# Patient Record
Sex: Female | Born: 1935 | Race: White | Hispanic: No | Marital: Married | Smoking: Former smoker
Health system: Southern US, Community
[De-identification: ages and names within clinical notes are randomized; demographics above are authoritative.]

## PROBLEM LIST (undated history)

## (undated) DIAGNOSIS — M1990 Unspecified osteoarthritis, unspecified site: Secondary | ICD-10-CM

## (undated) DIAGNOSIS — J449 Chronic obstructive pulmonary disease, unspecified: Secondary | ICD-10-CM

## (undated) DIAGNOSIS — C801 Malignant (primary) neoplasm, unspecified: Secondary | ICD-10-CM

## (undated) DIAGNOSIS — N289 Disorder of kidney and ureter, unspecified: Secondary | ICD-10-CM

## (undated) DIAGNOSIS — Z5189 Encounter for other specified aftercare: Secondary | ICD-10-CM

## (undated) DIAGNOSIS — I639 Cerebral infarction, unspecified: Secondary | ICD-10-CM

## (undated) HISTORY — PX: COLON SURGERY: SHX602

## (undated) HISTORY — PX: MASTECTOMY: SHX3

## (undated) HISTORY — PX: EYE SURGERY: SHX253

## (undated) HISTORY — PX: TONSILLECTOMY: SUR1361

## (undated) HISTORY — PX: SKIN BIOPSY: SHX1

---

## 1998-03-08 ENCOUNTER — Ambulatory Visit (HOSPITAL_COMMUNITY): Admission: RE | Admit: 1998-03-08 | Discharge: 1998-03-08 | Payer: Self-pay | Admitting: Gastroenterology

## 1999-09-22 ENCOUNTER — Encounter: Admission: RE | Admit: 1999-09-22 | Discharge: 1999-09-22 | Payer: Self-pay | Admitting: Internal Medicine

## 1999-09-22 ENCOUNTER — Encounter: Payer: Self-pay | Admitting: Internal Medicine

## 1999-09-29 ENCOUNTER — Encounter: Payer: Self-pay | Admitting: Internal Medicine

## 1999-09-29 ENCOUNTER — Ambulatory Visit (HOSPITAL_COMMUNITY): Admission: RE | Admit: 1999-09-29 | Discharge: 1999-09-29 | Payer: Self-pay | Admitting: Diagnostic Radiology

## 1999-09-29 ENCOUNTER — Encounter (INDEPENDENT_AMBULATORY_CARE_PROVIDER_SITE_OTHER): Payer: Self-pay | Admitting: Specialist

## 1999-10-20 ENCOUNTER — Ambulatory Visit (HOSPITAL_COMMUNITY): Admission: RE | Admit: 1999-10-20 | Discharge: 1999-10-20 | Payer: Self-pay | Admitting: General Surgery

## 1999-10-20 ENCOUNTER — Encounter (INDEPENDENT_AMBULATORY_CARE_PROVIDER_SITE_OTHER): Payer: Self-pay

## 2000-07-29 ENCOUNTER — Encounter: Payer: Self-pay | Admitting: Internal Medicine

## 2000-07-29 ENCOUNTER — Encounter: Admission: RE | Admit: 2000-07-29 | Discharge: 2000-07-29 | Payer: Self-pay | Admitting: Internal Medicine

## 2000-09-27 ENCOUNTER — Emergency Department (HOSPITAL_COMMUNITY): Admission: EM | Admit: 2000-09-27 | Discharge: 2000-09-27 | Payer: Self-pay | Admitting: *Deleted

## 2000-10-15 ENCOUNTER — Encounter: Payer: Self-pay | Admitting: Orthopedic Surgery

## 2000-10-15 ENCOUNTER — Ambulatory Visit (HOSPITAL_COMMUNITY): Admission: RE | Admit: 2000-10-15 | Discharge: 2000-10-15 | Payer: Self-pay | Admitting: Orthopedic Surgery

## 2001-08-01 ENCOUNTER — Encounter: Admission: RE | Admit: 2001-08-01 | Discharge: 2001-08-01 | Payer: Self-pay | Admitting: Internal Medicine

## 2001-08-01 ENCOUNTER — Encounter: Payer: Self-pay | Admitting: Internal Medicine

## 2001-11-07 ENCOUNTER — Other Ambulatory Visit: Admission: RE | Admit: 2001-11-07 | Discharge: 2001-11-07 | Payer: Self-pay | Admitting: Internal Medicine

## 2002-08-04 ENCOUNTER — Encounter: Payer: Self-pay | Admitting: Internal Medicine

## 2002-08-04 ENCOUNTER — Encounter: Admission: RE | Admit: 2002-08-04 | Discharge: 2002-08-04 | Payer: Self-pay | Admitting: Internal Medicine

## 2003-04-03 ENCOUNTER — Emergency Department (HOSPITAL_COMMUNITY): Admission: EM | Admit: 2003-04-03 | Discharge: 2003-04-03 | Payer: Self-pay | Admitting: Emergency Medicine

## 2003-04-03 ENCOUNTER — Encounter: Payer: Self-pay | Admitting: Emergency Medicine

## 2003-04-16 ENCOUNTER — Ambulatory Visit (HOSPITAL_COMMUNITY): Admission: RE | Admit: 2003-04-16 | Discharge: 2003-04-16 | Payer: Self-pay | Admitting: Internal Medicine

## 2003-04-16 ENCOUNTER — Encounter: Payer: Self-pay | Admitting: Internal Medicine

## 2004-01-11 ENCOUNTER — Encounter: Admission: RE | Admit: 2004-01-11 | Discharge: 2004-01-11 | Payer: Self-pay | Admitting: Internal Medicine

## 2004-09-16 ENCOUNTER — Ambulatory Visit (HOSPITAL_COMMUNITY): Admission: RE | Admit: 2004-09-16 | Discharge: 2004-09-16 | Payer: Self-pay | Admitting: Internal Medicine

## 2005-01-20 ENCOUNTER — Encounter: Admission: RE | Admit: 2005-01-20 | Discharge: 2005-01-20 | Payer: Self-pay | Admitting: Internal Medicine

## 2005-08-04 ENCOUNTER — Emergency Department (HOSPITAL_COMMUNITY): Admission: EM | Admit: 2005-08-04 | Discharge: 2005-08-04 | Payer: Self-pay | Admitting: Emergency Medicine

## 2006-02-15 ENCOUNTER — Encounter: Admission: RE | Admit: 2006-02-15 | Discharge: 2006-02-15 | Payer: Self-pay | Admitting: Internal Medicine

## 2006-03-08 ENCOUNTER — Encounter: Admission: RE | Admit: 2006-03-08 | Discharge: 2006-03-08 | Payer: Self-pay | Admitting: Internal Medicine

## 2007-02-22 ENCOUNTER — Encounter: Admission: RE | Admit: 2007-02-22 | Discharge: 2007-02-22 | Payer: Self-pay | Admitting: Internal Medicine

## 2007-11-25 ENCOUNTER — Encounter: Admission: RE | Admit: 2007-11-25 | Discharge: 2007-11-25 | Payer: Self-pay | Admitting: Internal Medicine

## 2008-03-01 ENCOUNTER — Encounter: Admission: RE | Admit: 2008-03-01 | Discharge: 2008-03-01 | Payer: Self-pay | Admitting: Internal Medicine

## 2009-03-06 ENCOUNTER — Encounter: Admission: RE | Admit: 2009-03-06 | Discharge: 2009-03-06 | Payer: Self-pay | Admitting: Internal Medicine

## 2010-03-07 ENCOUNTER — Encounter: Admission: RE | Admit: 2010-03-07 | Discharge: 2010-03-07 | Payer: Self-pay | Admitting: Internal Medicine

## 2010-12-07 ENCOUNTER — Encounter: Payer: Self-pay | Admitting: Internal Medicine

## 2012-12-11 ENCOUNTER — Observation Stay (HOSPITAL_COMMUNITY)
Admission: EM | Admit: 2012-12-11 | Discharge: 2012-12-14 | Disposition: A | Discharge status: Skilled Nursing Facility | Payer: Medicare Other | Attending: Internal Medicine | Admitting: Internal Medicine

## 2012-12-11 ENCOUNTER — Encounter (HOSPITAL_COMMUNITY): Payer: Self-pay | Admitting: Emergency Medicine

## 2012-12-11 ENCOUNTER — Emergency Department (HOSPITAL_COMMUNITY): Payer: Medicare Other

## 2012-12-11 DIAGNOSIS — J449 Chronic obstructive pulmonary disease, unspecified: Secondary | ICD-10-CM

## 2012-12-11 DIAGNOSIS — M8000XA Age-related osteoporosis with current pathological fracture, unspecified site, initial encounter for fracture: Secondary | ICD-10-CM | POA: Diagnosis present

## 2012-12-11 DIAGNOSIS — K90829 Short bowel syndrome, unspecified: Secondary | ICD-10-CM | POA: Diagnosis present

## 2012-12-11 DIAGNOSIS — IMO0002 Reserved for concepts with insufficient information to code with codable children: Secondary | ICD-10-CM

## 2012-12-11 DIAGNOSIS — K912 Postsurgical malabsorption, not elsewhere classified: Secondary | ICD-10-CM | POA: Insufficient documentation

## 2012-12-11 DIAGNOSIS — Z853 Personal history of malignant neoplasm of breast: Secondary | ICD-10-CM

## 2012-12-11 DIAGNOSIS — Z8673 Personal history of transient ischemic attack (TIA), and cerebral infarction without residual deficits: Secondary | ICD-10-CM | POA: Insufficient documentation

## 2012-12-11 DIAGNOSIS — H543 Unqualified visual loss, both eyes: Secondary | ICD-10-CM | POA: Diagnosis present

## 2012-12-11 DIAGNOSIS — N289 Disorder of kidney and ureter, unspecified: Secondary | ICD-10-CM | POA: Insufficient documentation

## 2012-12-11 DIAGNOSIS — M810 Age-related osteoporosis without current pathological fracture: Secondary | ICD-10-CM | POA: Insufficient documentation

## 2012-12-11 DIAGNOSIS — M549 Dorsalgia, unspecified: Secondary | ICD-10-CM

## 2012-12-11 DIAGNOSIS — N183 Chronic kidney disease, stage 3 unspecified: Secondary | ICD-10-CM | POA: Insufficient documentation

## 2012-12-11 DIAGNOSIS — W19XXXA Unspecified fall, initial encounter: Secondary | ICD-10-CM | POA: Insufficient documentation

## 2012-12-11 DIAGNOSIS — H3530 Unspecified macular degeneration: Secondary | ICD-10-CM | POA: Diagnosis present

## 2012-12-11 DIAGNOSIS — J4489 Other specified chronic obstructive pulmonary disease: Secondary | ICD-10-CM | POA: Insufficient documentation

## 2012-12-11 DIAGNOSIS — X58XXXA Exposure to other specified factors, initial encounter: Secondary | ICD-10-CM | POA: Insufficient documentation

## 2012-12-11 DIAGNOSIS — T148XXA Other injury of unspecified body region, initial encounter: Secondary | ICD-10-CM

## 2012-12-11 DIAGNOSIS — S22009A Unspecified fracture of unspecified thoracic vertebra, initial encounter for closed fracture: Principal | ICD-10-CM | POA: Insufficient documentation

## 2012-12-11 DIAGNOSIS — N189 Chronic kidney disease, unspecified: Secondary | ICD-10-CM

## 2012-12-11 DIAGNOSIS — M8448XA Pathological fracture, other site, initial encounter for fracture: Secondary | ICD-10-CM | POA: Insufficient documentation

## 2012-12-11 DIAGNOSIS — S22080A Wedge compression fracture of T11-T12 vertebra, initial encounter for closed fracture: Secondary | ICD-10-CM

## 2012-12-11 HISTORY — DX: Encounter for other specified aftercare: Z51.89

## 2012-12-11 HISTORY — DX: Malignant (primary) neoplasm, unspecified: C80.1

## 2012-12-11 HISTORY — DX: Cerebral infarction, unspecified: I63.9

## 2012-12-11 HISTORY — DX: Unspecified osteoarthritis, unspecified site: M19.90

## 2012-12-11 HISTORY — DX: Chronic obstructive pulmonary disease, unspecified: J44.9

## 2012-12-11 HISTORY — DX: Disorder of kidney and ureter, unspecified: N28.9

## 2012-12-11 LAB — CREATININE, SERUM
Creatinine, Ser: 1.7 mg/dL — ABNORMAL HIGH (ref 0.50–1.10)
GFR calc Af Amer: 29 mL/min — ABNORMAL LOW (ref >90)
GFR calc non Af Amer: 25 mL/min — ABNORMAL LOW (ref >90)

## 2012-12-11 LAB — CBC
HCT: 39.4 % (ref 36.0–46.0)
Hemoglobin: 13.4 g/dL (ref 12.0–15.0)
MCH: 30.1 pg (ref 26.0–34.0)
Platelets: 138 10*3/uL — ABNORMAL LOW (ref 150–400)
RDW: 12.9 % (ref 11.5–15.5)

## 2012-12-11 LAB — POCT I-STAT, CHEM 8
Calcium, Ion: 1.21 mmol/L (ref 1.13–1.30)
Glucose, Bld: 111 mg/dL — ABNORMAL HIGH (ref 70–99)
HCT: 39 % (ref 36.0–46.0)
Hemoglobin: 13.3 g/dL (ref 12.0–15.0)

## 2012-12-11 LAB — URINALYSIS, ROUTINE W REFLEX MICROSCOPIC
Glucose, UA: NEGATIVE mg/dL
Protein, ur: NEGATIVE mg/dL
Specific Gravity, Urine: 1.022 (ref 1.005–1.030)

## 2012-12-11 MED ORDER — HYDROMORPHONE HCL PF 1 MG/ML IJ SOLN
0.5000 mg | INTRAMUSCULAR | Status: DC | PRN
  Administered 2012-12-11: 1 mg via INTRAVENOUS
  Filled 2012-12-11: qty 1

## 2012-12-11 MED ORDER — METHOCARBAMOL 500 MG PO TABS
500.0000 mg | ORAL_TABLET | Freq: Three times a day (TID) | ORAL | Status: DC
  Administered 2012-12-11 – 2012-12-14 (×9): 500 mg via ORAL
  Filled 2012-12-11 (×14): qty 1

## 2012-12-11 MED ORDER — ONDANSETRON HCL 4 MG/2ML IJ SOLN
4.0000 mg | Freq: Once | INTRAMUSCULAR | Status: AC
  Administered 2012-12-11: 4 mg via INTRAVENOUS
  Filled 2012-12-11: qty 2

## 2012-12-11 MED ORDER — HYDROCODONE-ACETAMINOPHEN 5-325 MG PO TABS
1.0000 | ORAL_TABLET | ORAL | Status: DC | PRN
  Administered 2012-12-11 – 2012-12-14 (×12): 2 via ORAL
  Filled 2012-12-11 (×10): qty 2
  Filled 2012-12-11: qty 1
  Filled 2012-12-11: qty 2
  Filled 2012-12-11: qty 1

## 2012-12-11 MED ORDER — TRIAMTERENE-HCTZ 75-50 MG PO TABS
1.0000 | ORAL_TABLET | Freq: Every morning | ORAL | Status: DC
  Administered 2012-12-11 – 2012-12-14 (×4): 1 via ORAL
  Filled 2012-12-11 (×4): qty 1

## 2012-12-11 MED ORDER — ONDANSETRON HCL 4 MG/2ML IJ SOLN: 4.0000 mg | Freq: Four times a day (QID) | INTRAMUSCULAR | Status: DC | PRN

## 2012-12-11 MED ORDER — MORPHINE SULFATE 2 MG/ML IJ SOLN
1.0000 mg | INTRAMUSCULAR | Status: DC | PRN
  Administered 2012-12-12 – 2012-12-14 (×3): 2 mg via INTRAVENOUS
  Filled 2012-12-11 (×3): qty 1

## 2012-12-11 MED ORDER — ENOXAPARIN SODIUM 30 MG/0.3ML ~~LOC~~ SOLN
30.0000 mg | SUBCUTANEOUS | Status: DC
  Administered 2012-12-11 – 2012-12-13 (×3): 30 mg via SUBCUTANEOUS
  Filled 2012-12-11 (×4): qty 0.3

## 2012-12-11 MED ORDER — PROSIGHT PO TABS
1.0000 | ORAL_TABLET | Freq: Every day | ORAL | Status: DC
  Administered 2012-12-11 – 2012-12-13 (×2): 1 via ORAL
  Filled 2012-12-11 (×4): qty 1

## 2012-12-11 MED ORDER — HYDROCODONE-ACETAMINOPHEN 5-325 MG PO TABS: 1.0000 | ORAL_TABLET | ORAL | Status: DC | PRN

## 2012-12-11 MED ORDER — TIOTROPIUM BROMIDE MONOHYDRATE 18 MCG IN CAPS
18.0000 ug | ORAL_CAPSULE | Freq: Two times a day (BID) | RESPIRATORY_TRACT | Status: DC
  Administered 2012-12-12 – 2012-12-13 (×3): 18 ug via RESPIRATORY_TRACT
  Filled 2012-12-11: qty 5

## 2012-12-11 MED ORDER — CALCITONIN (SALMON) 200 UNIT/ACT NA SOLN
1.0000 | Freq: Every day | NASAL | Status: DC
  Administered 2012-12-11 – 2012-12-14 (×3): 1 via NASAL
  Filled 2012-12-11: qty 3.7

## 2012-12-11 MED ORDER — POTASSIUM CHLORIDE CRYS ER 20 MEQ PO TBCR
30.0000 meq | EXTENDED_RELEASE_TABLET | Freq: Every day | ORAL | Status: DC
  Administered 2012-12-11 – 2012-12-14 (×4): 30 meq via ORAL
  Filled 2012-12-11 (×4): qty 1

## 2012-12-11 MED ORDER — BISACODYL 5 MG PO TBEC: 10.0000 mg | DELAYED_RELEASE_TABLET | Freq: Every day | ORAL | Status: DC | PRN

## 2012-12-11 MED ORDER — CLORAZEPATE DIPOTASSIUM 7.5 MG PO TABS: 7.5000 mg | ORAL_TABLET | Freq: Every day | ORAL | Status: DC

## 2012-12-11 MED ORDER — OCUVITE PO TABS
1.0000 | ORAL_TABLET | Freq: Every morning | ORAL | Status: DC
  Filled 2012-12-11: qty 1

## 2012-12-11 MED ORDER — METOPROLOL SUCCINATE ER 50 MG PO TB24
50.0000 mg | ORAL_TABLET | Freq: Every morning | ORAL | Status: DC
  Administered 2012-12-12 – 2012-12-14 (×3): 50 mg via ORAL
  Filled 2012-12-11 (×3): qty 1

## 2012-12-11 MED ORDER — SODIUM CHLORIDE 0.9 % IV SOLN
INTRAVENOUS | Status: DC
  Administered 2012-12-11: 20:00:00 via INTRAVENOUS

## 2012-12-11 MED ORDER — ALBUTEROL SULFATE (5 MG/ML) 0.5% IN NEBU: 2.5000 mg | INHALATION_SOLUTION | RESPIRATORY_TRACT | Status: DC | PRN

## 2012-12-11 MED ORDER — CYANOCOBALAMIN 1000 MCG/ML IJ SOLN
1000.0000 ug | INTRAMUSCULAR | Status: DC
  Filled 2012-12-11: qty 1

## 2012-12-11 MED ORDER — CLORAZEPATE DIPOTASSIUM 7.5 MG PO TABS
7.5000 mg | ORAL_TABLET | Freq: Every day | ORAL | Status: DC
  Administered 2012-12-11 – 2012-12-13 (×3): 7.5 mg via ORAL
  Filled 2012-12-11 (×3): qty 1

## 2012-12-11 MED ORDER — CALCIUM CARBONATE ANTACID 500 MG PO CHEW
1.0000 | CHEWABLE_TABLET | Freq: Every morning | ORAL | Status: DC
  Administered 2012-12-11 – 2012-12-14 (×3): 200 mg via ORAL
  Filled 2012-12-11 (×4): qty 1

## 2012-12-11 MED ORDER — FENTANYL CITRATE 0.05 MG/ML IJ SOLN
50.0000 ug | INTRAMUSCULAR | Status: DC | PRN
  Administered 2012-12-11 (×5): 50 ug via INTRAVENOUS
  Filled 2012-12-11 (×5): qty 2

## 2012-12-11 MED ORDER — FAMOTIDINE 20 MG PO TABS
20.0000 mg | ORAL_TABLET | Freq: Two times a day (BID) | ORAL | Status: DC
  Administered 2012-12-11 – 2012-12-14 (×6): 20 mg via ORAL
  Filled 2012-12-11 (×7): qty 1

## 2012-12-11 MED ORDER — LOPERAMIDE HCL 2 MG PO CAPS: 2.0000 mg | ORAL_CAPSULE | Freq: Every day | ORAL | Status: DC | PRN

## 2012-12-11 MED ORDER — CYANOCOBALAMIN 1000 MCG/ML IJ SOLN: 1000.0000 ug | INTRAMUSCULAR | Status: DC

## 2012-12-11 MED ORDER — ADULT MULTIVITAMIN W/MINERALS CH
1.0000 | ORAL_TABLET | Freq: Every morning | ORAL | Status: DC
  Administered 2012-12-11 – 2012-12-14 (×4): 1 via ORAL
  Filled 2012-12-11 (×4): qty 1

## 2012-12-11 MED ORDER — ONDANSETRON HCL 4 MG PO TABS
4.0000 mg | ORAL_TABLET | Freq: Four times a day (QID) | ORAL | Status: DC | PRN
  Administered 2012-12-12: 4 mg via ORAL
  Filled 2012-12-11: qty 1

## 2012-12-11 MED ORDER — ACETAMINOPHEN 500 MG PO TABS: 500.0000 mg | ORAL_TABLET | Freq: Four times a day (QID) | ORAL | Status: DC | PRN

## 2012-12-11 MED ORDER — VITAMIN D (ERGOCALCIFEROL) 1.25 MG (50000 UNIT) PO CAPS
50000.0000 [IU] | ORAL_CAPSULE | ORAL | Status: DC
  Administered 2012-12-14: 50000 [IU] via ORAL
  Filled 2012-12-11 (×3): qty 1

## 2012-12-11 NOTE — ED Notes (Signed)
Patient states she started with lower back pain earlier in the day 12/10/12. Patient reports a history of back pain, but today was more severe than usual. Patient fell walking into bathroom, patient states she is unsure what caused her to fall. Patient denies any additional pain other than the initial back pain that started hours before. Family at bedside denies LOC. Patient currently rating her pain 10/10. Patient typically takes Tylenol for her pain, but husband at bedside states at approx 2200 he gave her 1- Vicodin for her pain.

## 2012-12-11 NOTE — Progress Notes (Signed)
Pt not voided since catheterized in ED. Dr Suanne Marker notified by phone & order given for insertion of foley. Betina Puckett, [employer]

## 2012-12-11 NOTE — ED Notes (Signed)
ZOX:[id number]<AV> Expected date:<BR> Expected time:<BR> Means of arrival:<BR> Comments:<BR>

## 2012-12-11 NOTE — Progress Notes (Signed)
Pt needing new pain med orders. Dr Suanne Marker paged. Paula Branch, [employer]

## 2012-12-11 NOTE — ED Notes (Signed)
Patient transported to X-ray 

## 2012-12-11 NOTE — ED Provider Notes (Signed)
History     CSN: [id number]  Arrival date & time 12/11/12  0343   First MD Initiated Contact with Patient 12/11/12 [id number]      Chief Complaint  Patient presents with  . Back Pain  . Fall    (Consider location/radiation/quality/duration/timing/severity/associated sxs/prior treatment) Patient is a 79 y.o. female presenting with back pain and fall.  Back Pain  Pertinent negatives include no chest pain, no fever, no abdominal pain, no dysuria and no weakness.  Fall Pertinent negatives include no fever and no abdominal pain.   Hx per PT and her husband. Initially PT relayed that she fell and was unable to get up due to low back pain.  Family states she developed LBP R sided last night without trauma, her husband gave her a hydrocodone and sometime during the night she fell and was unable to get up.  Pain is sharp and severe and radiating up mid back from R side, no weakness or numbness, unable to walk due to pain, no hip pain.  Pain worse with any kind of movement.  Past Medical History  Diagnosis Date  . Arthritis   . Blood transfusion without reported diagnosis   . Cancer     lung, R breast, skin  . COPD (chronic obstructive pulmonary disease)   . Renal disorder     stage 3 kidney disease  . Stroke     TIA    Past Surgical History  Procedure Date  . Mastectomy     right  . Colon surgery   . Eye surgery   . Skin biopsy   . Tonsillectomy     History reviewed. No pertinent family history.  History  Substance Use Topics  . Smoking status: Former Games developer  . Smokeless tobacco: Not on file  . Alcohol Use: No    OB History    Grav Para Term Preterm Abortions TAB SAB Ect Mult Living                  Review of Systems  Constitutional: Negative for fever and chills.  HENT: Negative for neck pain.   Eyes: Negative for visual disturbance.  Respiratory: Negative for shortness of breath.   Cardiovascular: Negative for chest pain.  Gastrointestinal: Negative for abdominal  pain.  Genitourinary: Negative for dysuria.  Musculoskeletal: Positive for back pain.  Skin: Negative for rash.  Neurological: Negative for weakness.  All other systems reviewed and are negative.    Allergies  Review of patient's allergies indicates no known allergies.  Home Medications   Current Outpatient Rx  Name  Route  Sig  Dispense  Refill  . ACETAMINOPHEN 500 MG PO TABS   Oral   Take 500 mg by mouth every 6 (six) hours as needed. For pain         . ALBUTEROL SULFATE HFA 108 (90 BASE) MCG/ACT IN AERS   Inhalation   Inhale 2 puffs into the lungs every 6 (six) hours as needed. For wheezing or shortness of breath         . ALBUTEROL SULFATE (2.5 MG/3ML) 0.083% IN NEBU   Nebulization   Take 2.5 mg by nebulization every 6 (six) hours as needed. For wheezing or shortness of breath         . OCUVITE PO TABS   Oral   Take 1 tablet by mouth every morning.         Marland Kitchen CALCIUM CARBONATE ANTACID 500 MG PO CHEW   Oral   Chew  1 tablet by mouth every morning.         Marland Kitchen CLORAZEPATE DIPOTASSIUM 7.5 MG PO TABS   Oral   Take 7.5 mg by mouth daily.         . CYANOCOBALAMIN 1000 MCG/ML IJ SOLN   Intramuscular   Inject 1,000 mcg into the muscle every 30 (thirty) days.         Marland Kitchen FAMOTIDINE 20 MG PO TABS   Oral   Take 20 mg by mouth 2 (two) times daily as needed. For acid reflux         . GLUCOSAMINE CHONDR COMPLEX PO   Oral   Take 1 tablet by mouth every morning.         Marland Kitchen LOPERAMIDE HCL 2 MG PO CAPS   Oral   Take 2 mg by mouth daily as needed. For diarrhea         . METOPROLOL SUCCINATE ER 50 MG PO TB24   Oral   Take 50 mg by mouth every morning. Take with or immediately following a meal.         . ADULT MULTIVITAMIN W/MINERALS CH   Oral   Take 1 tablet by mouth every morning.         Marland Kitchen POTASSIUM CHLORIDE CRYS ER 20 MEQ PO TBCR   Oral   Take 30 mEq by mouth daily.         Marland Kitchen TIOTROPIUM BROMIDE MONOHYDRATE 18 MCG IN CAPS   Inhalation   Place  18 mcg into inhaler and inhale 2 (two) times daily.         . TRIAMTERENE-HCTZ 75-50 MG PO TABS   Oral   Take 1 tablet by mouth every morning.         Marland Kitchen VITAMIN D (ERGOCALCIFEROL) 50000 UNITS PO CAPS   Oral   Take 50,000 Units by mouth 3 (three) times a week.           BP 124/66  Pulse 70  Temp 97.9 F (36.6 C) (Oral)  Resp 16  SpO2 92%  Physical Exam  Constitutional: She is oriented to person, place, and time. She appears well-developed and well-nourished.  HENT:  Head: Normocephalic and atraumatic.  Eyes: EOM are normal. Pupils are equal, round, and reactive to light.  Neck: Neck supple.       No cervical spine tenderness  Cardiovascular: Normal rate, regular rhythm and intact distal pulses.   Pulmonary/Chest: Effort normal and breath sounds normal. No respiratory distress. She exhibits no tenderness.  Abdominal: Soft. Bowel sounds are normal. She exhibits no distension. There is no tenderness.  Musculoskeletal: She exhibits no edema.       TTP lower lumbar and R paralumbar, pelvis stable and nontender, no LE deficits with sensorium to light touch and dorsi/ plantar flexion equal/ intact. Distal pulses intact  Neurological: She is alert and oriented to person, place, and time.  Skin: Skin is warm and dry.    ED Course  Procedures (including critical care time)  Dg Thoracic Spine 2 View  12/11/2012  *RADIOLOGY REPORT*  Clinical Data: Status post fall; upper back pain.  THORACIC SPINE - 2 VIEW  Comparison: Chest radiograph performed 06/27/2012  Findings: There is no evidence of acute fracture or subluxation. There is a grossly stable compression deformity involving vertebral body T12; a partially imaged compression deformity of L2 was not previously imaged, but appears chronic on lumbar spine radiographs, without evidence of retropulsion.  Vertebral bodies demonstrate normal alignment.  Intervertebral disc spaces are preserved.  Mild scarring is noted at the left lung  base.  Postoperative change is seen overlying the mediastinum.  IMPRESSION:  1.  No evidence of acute fracture or subluxation along the thoracic spine. 2.  Chronic compression deformities involving vertebral bodies T12 and L2.   Original Report Authenticated By: Tonia Ghent, M.D.    Dg Lumbar Spine Complete  12/11/2012  *RADIOLOGY REPORT*  Clinical Data: Status post fall; upper back pain, radiating to the lower back.  LUMBAR SPINE - COMPLETE 4+ VIEW  Comparison: None.  Findings: There is no evidence of acute fracture or subluxation. There appear to be chronic compression deformities at T12 and L2; no retropulsion is characterized.  Vertebral bodies demonstrate normal alignment.  Intervertebral disc spaces are preserved.  The visualized neural foramina are grossly unremarkable in appearance.  The visualized bowel gas pattern is unremarkable in appearance; air and stool are noted within the colon.  The sacroiliac joints are within normal limits.  Diffuse vascular calcifications are seen.  A bowel suture line is noted at the right lower quadrant.  IMPRESSION:  1.  No evidence of acute fracture or subluxation along the lumbar spine. 2.  Apparent chronic compression deformities at T12 and L2. 3.  Diffuse vascular calcifications seen.   Original Report Authenticated By: Tonia Ghent, M.D.    IV Fentanyl, good pain control at rest  7:37 AM PT unable to walk due to severe pain. Labs and Ct ordered/ pending. Plan MED admit for new onset symptoms unable to ambualte  MDM    79 yo female with LBP and fall, unable to walk 2/2 pain despite IV narcotics and pain control at rest. Labs, imaging and plan MED admit      Sunnie Nielsen, MD 12/11/12 2312

## 2012-12-11 NOTE — H&P (Signed)
[facility] History and Physical  MEYER DOCKERY ZOX:[id number] DOB: [birth year] DOA: 12/11/2012  Referring physician: Dr Anitra Lauth PCP: Darnelle Bos, MD  Specialists:   Chief Complaint: increased back pain  HPI: Paula Branch is a 79 y.o. female with history of COPD, CKD stage III presents with complaints of worsening back pain times one day. She states that for the past few weeks she has had back pain, but yesterday having more pain and she took a dose of her husbands and pain medication in about 2:30 when she woke up she fell and the back pain gotmuch worse so she came to the ED. She denies syncope also denies dizziness,chest pain, also denies fevers dysuria and no hematochezia. She was seen in the ED and x-ray of her lumbar spine on showed no evidence of acute fracture or subluxation, apparent chronic compression deformities at T12 and L2 were noted. A CT scan of her abdomen and pelvis was done and showed a nonobstructive 7 x 10 mm calculus in the lower pole of the left kidney, and compression fractures of T12 and L2 -T12 may be a acute or subacute. Despite 3 doses of iv fentanyl in the ED her pain was not controlled and so is admitted for further evaluation and management. Her creatinine was noted to be 1.7, baseline not available on Epic.   Review of Systems: The patient denies anorexia, fever, weight loss, vision loss, decreased hearing, hoarseness, chest pain, syncope, dyspnea on exertion, peripheral edema, balance deficits, hemoptysis, abdominal pain, melena, hematochezia, severe indigestion/heartburn, hematuria, incontinence, , muscle weakness, transient blindness, difficulty walking, depression, unusual weight change, abnormal bleeding.  Past Medical History  Diagnosis Date  . Arthritis   . Blood transfusion without reported diagnosis   . Cancer     lung, R breast, skin  . COPD (chronic obstructive pulmonary disease)   . Renal disorder     stage 3 kidney  disease  . Stroke     TIA   Past Surgical History  Procedure Date  . Mastectomy     right  . Colon surgery    . Eye surgery   . Skin biopsy   . Tonsillectomy    Social History:  reports that she has quit smoking. She does not have any smokeless tobacco history on file. She reports that she does not drink alcohol or use illicit drugs. where does patient live--home Can patient participate in ADLs  No Known Allergies    Prior to Admission medications   Medication Sig Start Date End Date Taking? Authorizing Provider  acetaminophen (TYLENOL) 500 MG tablet Take 500 mg by mouth every 6 (six) hours as needed. For pain   Yes Historical Provider, MD  albuterol (PROVENTIL HFA;VENTOLIN HFA) 108 (90 BASE) MCG/ACT inhaler Inhale 2 puffs into the lungs every 6 (six) hours as needed. For wheezing or shortness of breath   Yes Historical Provider, MD  albuterol (PROVENTIL) (2.5 MG/3ML) 0.083% nebulizer solution Take 2.5 mg by nebulization every 6 (six) hours as needed. For wheezing or shortness of breath   Yes Historical Provider, MD  beta carotene w/minerals (OCUVITE) tablet Take 1 tablet by mouth every morning.   Yes Historical Provider, MD  calcium carbonate (TUMS - DOSED IN MG ELEMENTAL CALCIUM) 500 MG chewable tablet Chew 1 tablet by mouth every morning.   Yes Historical Provider, MD  clorazepate (TRANXENE) 7.5 MG tablet Take 7.5 mg by mouth daily.   Yes Historical Provider, MD  cyanocobalamin (,VITAMIN B-12,) 1000 MCG/ML injection Inject  1,000 mcg into the muscle every 30 (thirty) days.   Yes Historical Provider, MD  famotidine (PEPCID) 20 MG tablet Take 20 mg by mouth 2 (two) times daily as needed. For acid reflux   Yes Historical Provider, MD  Glucosamine-Chondroitin (GLUCOSAMINE CHONDR COMPLEX PO) Take 1 tablet by mouth every morning.   Yes Historical Provider, MD  loperamide (IMODIUM) 2 MG capsule Take 2 mg by mouth daily as needed. For diarrhea   Yes Historical Provider, MD  metoprolol  succinate (TOPROL-XL) 50 MG 24 hr tablet Take 50 mg by mouth every morning. Take with or immediately following a meal.   Yes Historical Provider, MD  Multiple Vitamin (MULTIVITAMIN WITH MINERALS) TABS Take 1 tablet by mouth every morning.   Yes Historical Provider, MD  potassium chloride SA (K-DUR,KLOR-CON) 20 MEQ tablet Take 30 mEq by mouth daily.   Yes Historical Provider, MD  tiotropium (SPIRIVA) 18 MCG inhalation capsule Place 18 mcg into inhaler and inhale 2 (two) times daily.   Yes Historical Provider, MD  triamterene-hydrochlorothiazide (MAXZIDE) 75-50 MG per tablet Take 1 tablet by mouth every morning.   Yes Historical Provider, MD  Vitamin D, Ergocalciferol, (DRISDOL) 50000 UNITS CAPS Take 50,000 Units by mouth 3 (three) times a week.   Yes Historical Provider, MD   Physical Exam: Filed Vitals:   12/11/12 0348 12/11/12 0713 12/11/12 1125 12/11/12 1220  BP: 118/47 124/66 108/47 127/68  Pulse: 66 70 75 80  Temp: 97.9 F (36.6 C)   98.3 F (36.8 C)  TempSrc: Oral   Oral  Resp: 20 16 18 18   SpO2: 97% 92% 94% 90%   Constitutional: Vital signs reviewed.  Patient is a well-developed and well-nourished  in no acute distress and cooperative with exam. Alert and oriented x3.  Head: Normocephalic and atraumatic Mouth: no erythema or exudates, MMM Eyes: PERRL, EOMI, conjunctivae normal, No scleral icterus.  Neck: Supple, Trachea midline normal ROM, No JVD, mass, thyromegaly, or carotid bruit present.  Cardiovascular: RRR, S1 normal, S2 normal, no MRG, pulses symmetric and intact bilaterally Pulmonary/Chest: Moderate air movement, no wheezes, rales, or rhonchi Abdominal: Soft. Non-tender, non-distended, bowel sounds are normal, no masses, organomegaly, or guarding present.  Back: Right lower parathoracic and R. upper paralumbar area markedly tender, range of motion decreased secondary to pain. Also tender over T12 vertebral area. Neurological: A&O x3,  cranial nerve II-XII are grossly intact,  no focal motor deficit, sensory grossly intact.  Skin: Warm, dry and intact. No rash.  Psychiatric: Normal mood and affect. speech and behavior is normal. J    Labs on Admission:  Basic Metabolic Panel:  Lab 12/11/12 2130  NA 142  K 3.7  CL 106  CO2 --  GLUCOSE 111*  BUN 24*  CREATININE 1.70*  CALCIUM --  MG --  PHOS --   Liver Function Tests: No results found for this basename: AST:5,ALT:5,ALKPHOS:5,BILITOT:5,PROT:5,ALBUMIN:5 in the last 168 hours No results found for this basename: LIPASE:5,AMYLASE:5 in the last 168 hours No results found for this basename: AMMONIA:5 in the last 168 hours CBC:  Lab 12/11/12 0841 12/11/12 0812  WBC -- 11.2*  NEUTROABS -- --  HGB 13.3 13.4  HCT 39.0 39.4  MCV -- 88.5  PLT -- 138*   Cardiac Enzymes: No results found for this basename: CKTOTAL:5,CKMB:5,CKMBINDEX:5,TROPONINI:5 in the last 168 hours  BNP (last 3 results) No results found for this basename: PROBNP:3 in the last 8760 hours CBG: No results found for this basename: GLUCAP:5 in the last 168 hours  Radiological Exams on Admission: Ct Abdomen Pelvis Wo Contrast  12/11/2012  *RADIOLOGY REPORT*  Clinical Data: Right flank pain and right-sided low back pain. Current history of lung cancer.  Recent fall.  CT ABDOMEN AND PELVIS WITHOUT CONTRAST  Technique:  Multidetector CT imaging of the abdomen and pelvis was performed following the standard protocol without intravenous contrast.  Comparison: None.  Findings: Approximate 7 x 10 mm non-obstructing calculus in a lower pole calix of the left kidney.  No urinary tract calculi elsewhere on either side.  Cortical thinning involving both kidneys consistent with age.  Mild perinephric stranding bilaterally. Within the limits of the unenhanced technique, no focal parenchymal abnormality involving either kidney.  Normal unenhanced appearance of the liver, pancreas, adrenal glands, and gallbladder.  No biliary ductal dilation.  Dystrophic  appearing calcifications in the spleen which is otherwise unremarkable.  Moderate to severe aorto-iliofemoral atherosclerosis without aneurysm.  No significant lymphadenopathy.  Small hiatal hernia.  Stomach decompressed and otherwise unremarkable.  Ileocolic anastomosis (ileum to the ascending colon) in the right side of the abdomen which appears patent.  Small bowel normal in appearance.  Moderate stool burden in the colon. Scattered sigmoid colon diverticula.  Sigmoid colon tortuous and redundant.:  Otherwise unremarkable.  Appendix surgically absent. No ascites.  Uterus is markedly atrophic consistent with age.  No adnexal masses or free pelvic fluid.  Urinary bladder unremarkable. Bone window images demonstrate compression fractures involving T12 and L2; the T12 fracture may be acute subacute.  Visualized lung bases clear apart from minimal scarring in the lingula.  Heart size upper normal.  IMPRESSION:  1.  Non-obstructing 7 x 10 mm calculus in a lower pole calix of the left kidney.  No urinary tract calculi elsewhere. 2.  Small hiatal hernia. 3.  Scattered sigmoid colon diverticula without evidence of acute diverticulitis. 4.  Compression fractures of T12 and L2.  The T12 fracture may be acute or subacute.  Please correlate with point tenderness.   Original Report Authenticated By: Hulan Saas, M.D.    Dg Thoracic Spine 2 View  12/11/2012  *RADIOLOGY REPORT*  Clinical Data: Status post fall; upper back pain.  THORACIC SPINE - 2 VIEW  Comparison: Chest radiograph performed 06/27/2012  Findings: There is no evidence of acute fracture or subluxation. There is a grossly stable compression deformity involving vertebral body T12; a partially imaged compression deformity of L2 was not previously imaged, but appears chronic on lumbar spine radiographs, without evidence of retropulsion.  Vertebral bodies demonstrate normal alignment.  Intervertebral disc spaces are preserved.  Mild scarring is noted at the left  lung base.  Postoperative change is seen overlying the mediastinum.  IMPRESSION:  1.  No evidence of acute fracture or subluxation along the thoracic spine. 2.  Chronic compression deformities involving vertebral bodies T12 and L2.   Original Report Authenticated By: Tonia Ghent, M.D.    Dg Lumbar Spine Complete  12/11/2012  *RADIOLOGY REPORT*  Clinical Data: Status post fall; upper back pain, radiating to the lower back.  LUMBAR SPINE - COMPLETE 4+ VIEW  Comparison: None.  Findings: There is no evidence of acute fracture or subluxation. There appear to be chronic compression deformities at T12 and L2; no retropulsion is characterized.  Vertebral bodies demonstrate normal alignment.  Intervertebral disc spaces are preserved.  The visualized neural foramina are grossly unremarkable in appearance.  The visualized bowel gas pattern is unremarkable in appearance; air and stool are noted within the colon.  The sacroiliac joints are within  normal limits.  Diffuse vascular calcifications are seen.  A bowel suture line is noted at the right lower quadrant.  IMPRESSION:  1.  No evidence of acute fracture or subluxation along the lumbar spine. 2.  Apparent chronic compression deformities at T12 and L2. 3.  Diffuse vascular calcifications seen.   Original Report Authenticated By: Tonia Ghent, M.D.      Assessment/Plan Active Problems:  T12 compression fracture -As discussed above, will admit for observation- pain management-narcotics, muscle relaxants, also add calcitonin spray -Avoid NSAIDs given her renal insufficiency -Continue calcium supplementation. -PTOT consult -Deferred to her PCP/Dr. Earl Gala for further recommendations in am.  CKD (chronic kidney disease) -Gentle hydration, baseline creatinine on follow and recheck. COPD -Stable, continue outpatient meds/bronchodilators.     Family Communication:  Husband and family at bedside   Disposition Plan:  Admit to med floor  Time spent:  >74mins  Kela Millin [facility] Pager [phone]  If 7PM-7AM, please contact night-coverage [email or web address] Password [email or web address] 12/11/2012, 2:22 PM

## 2012-12-11 NOTE — Progress Notes (Signed)
Pt needing pain med order, Dr Suanne Marker paged. Paula Branch, [employer]

## 2012-12-11 NOTE — ED Notes (Signed)
Pt and family updated that they are waiting on hospitalist to see them.

## 2012-12-11 NOTE — ED Notes (Addendum)
Per EMS. Patient c/o back pain onset 6 hours ago. Patient fell at 0245. Patient found prone in the bathroom floor. Patient states back pain is the same prior to the fall as it was after the fall. Patient requested assistance up and evaluation. Patient reported to EMS that she tripped going into the bathroom. EMS completed a stroke screen, which was negative. Patient with hx of lung cancer, another cancer, stage 3 chronic kidney disease. Patient denies LOC, no additional pain complaints.

## 2012-12-12 DIAGNOSIS — Z853 Personal history of malignant neoplasm of breast: Secondary | ICD-10-CM

## 2012-12-12 LAB — BASIC METABOLIC PANEL
Calcium: 8.7 mg/dL (ref 8.4–10.5)
GFR calc non Af Amer: 26 mL/min — ABNORMAL LOW (ref >90)
Sodium: 137 mEq/L (ref 135–145)

## 2012-12-12 LAB — TSH: TSH: 1.664 u[IU]/mL (ref 0.350–4.500)

## 2012-12-12 MED ORDER — ENSURE COMPLETE PO LIQD
237.0000 mL | Freq: Two times a day (BID) | ORAL | Status: DC
  Administered 2012-12-13: 237 mL via ORAL

## 2012-12-12 NOTE — Care Management Note (Signed)
    Page 1 of 1   12/12/2012     2:04:33 PM   CARE MANAGEMENT NOTE 12/12/2012  Patient:  Paula Branch, Paula Branch   Account Number:  [id number]  Date Initiated:  12/12/2012  Documentation initiated by:  Colleen Can  Subjective/Objective Assessment:   DX FALL; BACK PAIN; T12     Action/Plan:   CM SPOKE WITH PATIENT. Plans are for pt to go to SNF rehab   Anticipated DC Date:  12/13/2012   Anticipated DC Plan:  SKILLED NURSING FACILITY  In-house referral  Clinical Social Worker      DC Planning Services  CM consult      Choice offered to / List presented to:             Status of service:  Completed, signed off Medicare Important Message given?  NA - LOS <3 / Initial given by admissions (If response is "NO", the following Medicare IM given date fields will be blank) Date Medicare IM given:   Date Additional Medicare IM given:    Discharge Disposition:    Per UR Regulation:  Reviewed for med. necessity/level of care/duration of stay  If discussed at Long Length of Stay Meetings, dates discussed:    Comments:

## 2012-12-12 NOTE — Progress Notes (Signed)
Clinical Social Work Department CLINICAL SOCIAL WORK PLACEMENT NOTE 12/12/2012  Patient:  Paula Branch, Paula Branch  Account Number:  [id number] Admit date:  12/11/2012  Clinical Social Worker:  Cori Razor, LCSW  Date/time:  12/12/2012 04:45 PM  Clinical Social Work is seeking post-discharge placement for this patient at the following level of care:   SKILLED NURSING   (*CSW will update this form in Epic as items are completed)   12/12/2012  Patient/family provided with [facility] Department of Clinical Social Work's list of facilities offering this level of care within the geographic area requested by the patient (or if unable, by the patient's family).  12/12/2012  Patient/family informed of their freedom to choose among providers that offer the needed level of care, that participate in Medicare, Medicaid or managed care program needed by the patient, have an available bed and are willing to accept the patient.    Patient/family informed of [facility]' ownership interest in [facility], as well as of the fact that they are under no obligation to receive care at this facility.  PASARR submitted to EDS on 12/12/2012 PASARR number received from EDS on 12/12/2012  FL2 transmitted to all facilities in geographic area requested by pt/family on  12/12/2012 FL2 transmitted to all facilities within larger geographic area on   Patient informed that his/her managed care company has contracts with or will negotiate with  certain facilities, including the following:     Patient/family informed of bed offers received:   Patient chooses bed at  Physician recommends and patient chooses bed at    Patient to be transferred to  on   Patient to be transferred to facility by   The following physician request were entered in Epic:   Additional Comments:  Cori Razor LCSW [phone]

## 2012-12-12 NOTE — Progress Notes (Signed)
Clinical Social Work Department BRIEF PSYCHOSOCIAL ASSESSMENT 12/12/2012  Patient:  Paula Branch, Paula Branch     Account Number:  [id number]     Admit date:  12/11/2012  Clinical Social Worker:  Candie Chroman  Date/Time:  12/12/2012 02:36 PM  Referred by:  Physician  Date Referred:  12/12/2012 Referred for  SNF Placement   Other Referral:   Interview type:  Family Other interview type:    PSYCHOSOCIAL DATA Living Status:  HUSBAND Admitted from facility:   Level of care:   Primary support name:  Paula Branch Primary support relationship to patient:  SPOUSE Degree of support available:   supportive    CURRENT CONCERNS Current Concerns  Post-Acute Placement   Other Concerns:    SOCIAL WORK ASSESSMENT / PLAN Pt is a 79 yr old female living at home prior to hospitalization. CSW met with pt/spouse/daughter to assist with d/c planning.ST SNF is needed following hospitalization. Pt/ family are in agreement with d/c plan. SNF search has been initiated and bed offers will be provided as received.   Assessment/plan status:  Psychosocial Support/Ongoing Assessment of Needs Other assessment/ plan:   Information/referral to community resources:   SNF list provided.    PATIENT'S/FAMILY'S RESPONSE TO PLAN OF CARE: Pt / family feel ST rehab is needed. CSW has contacted requested SNF's ( awaiting responses ).   Cori Razor LCSW [phone]

## 2012-12-12 NOTE — Progress Notes (Signed)
Assessment/Plan: Active Problems:  T12 compression fracture - this appears to be acute and may be from her fall. She is not sure if she passed out or not but she does not remember falling. Pain meds helping. Will see how she does with PT/OT. Try to get up into chair today. Watch for constipation. Will review office chart for any BMD studies in the past.   COPD (chronic obstructive pulmonary disease) - O2 sats often borderline at home. Does not use oxygen at home  CKD (chronic kidney disease) - this is at her baseline, I think.   History of breast cancer   Subjective: Feels better. Pain meds seem to be helping some. Is able to move in bed a bit.   Objective:  Vital Signs: Filed Vitals:   12/11/12 1220 12/11/12 1800 12/11/12 2215 12/12/12 0500  BP: 127/68  158/73 111/57  Pulse: 80  87 81  Temp: 98.3 F (36.8 C)  98.2 F (36.8 C) 98.5 F (36.9 C)  TempSrc: Oral  Oral Oral  Resp: 18  20 24   Height:  5\' 4"  (1.626 m)    Weight:  63.957 kg (141 lb)    SpO2: 90%  90% 85%     EXAM: Mild discomfort   Intake/Output Summary (Last 24 hours) at 12/12/12 0554 Last data filed at 12/12/12 0500  Gross per 24 hour  Intake 438.75 ml  Output    450 ml  Net -11.25 ml    Lab Results:  Basename 12/11/12 1513 12/11/12 0841  NA -- 142  K -- 3.7  CL -- 106  CO2 -- --  GLUCOSE -- 111*  BUN -- 24*  CREATININE 1.70* 1.70*  CALCIUM -- --  MG -- --  PHOS -- --   No results found for this basename: AST:2,ALT:2,ALKPHOS:2,BILITOT:2,PROT:2,ALBUMIN:2 in the last 72 hours No results found for this basename: LIPASE:2,AMYLASE:2 in the last 72 hours  Basename 12/11/12 0841 12/11/12 0812  WBC -- 11.2*  NEUTROABS -- --  HGB 13.3 13.4  HCT 39.0 39.4  MCV -- 88.5  PLT -- 138*   No results found for this basename: CKTOTAL:3,CKMB:3,CKMBINDEX:3,TROPONINI:3 in the last 72 hours No components found with this basename: POCBNP:3 No results found for this basename: DDIMER:2 in the last 72 hours No  results found for this basename: HGBA1C:2 in the last 72 hours No results found for this basename: CHOL:2,HDL:2,LDLCALC:2,TRIG:2,CHOLHDL:2,LDLDIRECT:2 in the last 72 hours  Basename 12/11/12 1513  TSH 1.664  T4TOTAL --  T3FREE --  THYROIDAB --   No results found for this basename: VITAMINB12:2,FOLATE:2,FERRITIN:2,TIBC:2,IRON:2,RETICCTPCT:2 in the last 72 hours  Studies/Results: Ct Abdomen Pelvis Wo Contrast  12/11/2012  *RADIOLOGY REPORT*  Clinical Data: Right flank pain and right-sided low back pain. Current history of lung cancer.  Recent fall.  CT ABDOMEN AND PELVIS WITHOUT CONTRAST  Technique:  Multidetector CT imaging of the abdomen and pelvis was performed following the standard protocol without intravenous contrast.  Comparison: None.  Findings: Approximate 7 x 10 mm non-obstructing calculus in a lower pole calix of the left kidney.  No urinary tract calculi elsewhere on either side.  Cortical thinning involving both kidneys consistent with age.  Mild perinephric stranding bilaterally. Within the limits of the unenhanced technique, no focal parenchymal abnormality involving either kidney.  Normal unenhanced appearance of the liver, pancreas, adrenal glands, and gallbladder.  No biliary ductal dilation.  Dystrophic appearing calcifications in the spleen which is otherwise unremarkable.  Moderate to severe aorto-iliofemoral atherosclerosis without aneurysm.  No significant lymphadenopathy.  Small hiatal hernia.  Stomach decompressed and otherwise unremarkable.  Ileocolic anastomosis (ileum to the ascending colon) in the right side of the abdomen which appears patent.  Small bowel normal in appearance.  Moderate stool burden in the colon. Scattered sigmoid colon diverticula.  Sigmoid colon tortuous and redundant.:  Otherwise unremarkable.  Appendix surgically absent. No ascites.  Uterus is markedly atrophic consistent with age.  No adnexal masses or free pelvic fluid.  Urinary bladder unremarkable.  Bone window images demonstrate compression fractures involving T12 and L2; the T12 fracture may be acute subacute.  Visualized lung bases clear apart from minimal scarring in the lingula.  Heart size upper normal.  IMPRESSION:  1.  Non-obstructing 7 x 10 mm calculus in a lower pole calix of the left kidney.  No urinary tract calculi elsewhere. 2.  Small hiatal hernia. 3.  Scattered sigmoid colon diverticula without evidence of acute diverticulitis. 4.  Compression fractures of T12 and L2.  The T12 fracture may be acute or subacute.  Please correlate with point tenderness.   Original Report Authenticated By: Hulan Saas, M.D.    Dg Thoracic Spine 2 View  12/11/2012  *RADIOLOGY REPORT*  Clinical Data: Status post fall; upper back pain.  THORACIC SPINE - 2 VIEW  Comparison: Chest radiograph performed 06/27/2012  Findings: There is no evidence of acute fracture or subluxation. There is a grossly stable compression deformity involving vertebral body T12; a partially imaged compression deformity of L2 was not previously imaged, but appears chronic on lumbar spine radiographs, without evidence of retropulsion.  Vertebral bodies demonstrate normal alignment.  Intervertebral disc spaces are preserved.  Mild scarring is noted at the left lung base.  Postoperative change is seen overlying the mediastinum.  IMPRESSION:  1.  No evidence of acute fracture or subluxation along the thoracic spine. 2.  Chronic compression deformities involving vertebral bodies T12 and L2.   Original Report Authenticated By: Tonia Ghent, M.D.    Dg Lumbar Spine Complete  12/11/2012  *RADIOLOGY REPORT*  Clinical Data: Status post fall; upper back pain, radiating to the lower back.  LUMBAR SPINE - COMPLETE 4+ VIEW  Comparison: None.  Findings: There is no evidence of acute fracture or subluxation. There appear to be chronic compression deformities at T12 and L2; no retropulsion is characterized.  Vertebral bodies demonstrate normal  alignment.  Intervertebral disc spaces are preserved.  The visualized neural foramina are grossly unremarkable in appearance.  The visualized bowel gas pattern is unremarkable in appearance; air and stool are noted within the colon.  The sacroiliac joints are within normal limits.  Diffuse vascular calcifications are seen.  A bowel suture line is noted at the right lower quadrant.  IMPRESSION:  1.  No evidence of acute fracture or subluxation along the lumbar spine. 2.  Apparent chronic compression deformities at T12 and L2. 3.  Diffuse vascular calcifications seen.   Original Report Authenticated By: Tonia Ghent, M.D.    Medications: Medications administered in the last 24 hours reviewed.  Current Medication List reviewed.    LOS: 1 day   [facility] @ [facility] [phone]) 12/12/2012, 5:54 AM

## 2012-12-12 NOTE — Progress Notes (Signed)
INITIAL NUTRITION ASSESSMENT  Pt meets criteria for severe MALNUTRITION in the context of chronic illness as evidenced by <75% estimated energy intake with 10.7% weight loss in the past 4 months per family report.  DOCUMENTATION CODES Per approved criteria  -Severe malnutrition in the context of chronic illness   INTERVENTION: - Ensure Complete BID  - Diet liberalization per MD to assist pt with more calorie/protein menu choices - Recommend GI consult r/t family report of pt with chronic diarrhea after meals - RD to continue to monitor   NUTRITION DIAGNOSIS: Predicted suboptimal energy intake related to history of poor appetite/intake as evidenced by family statement.   Goal: Pt to consume >90% of meals/supplements  Monitor:  Weights, labs, intake   Reason for Assessment: Nutrition risk   80 y.o. female  Admitting Dx: Increased back pain  ASSESSMENT: Pt asleep from pain medication, husband and daughter present at bedside. They report pt with poor appetite for years with 17 pound unintended weight loss in the past 4 months. Daughter reports pt with extensive history of intestinal surgery which causes pt to have diarrhea after meals. She reports this has been going on for the past 30 years. Husband states in the past week pt has started taking a probiotic prescribed by her MD for this issue. Daughter reports pt not on any nutritional supplements at home but family would like her to get Ensure during admission.   Height: Ht Readings from Last 1 Encounters:  12/11/12 5\' 4"  (1.626 m)    Weight: Wt Readings from Last 1 Encounters:  12/11/12 141 lb (63.957 kg)    Ideal Body Weight: 120 lb  % Ideal Body Weight: 117  Wt Readings from Last 10 Encounters:  12/11/12 141 lb (63.957 kg)    Usual Body Weight: 158 lb per family report  % Usual Body Weight: 89  BMI:  Body mass index is 24.20 kg/(m^2).  Estimated Nutritional Needs: Kcal: 1600-1900 Protein: 75-90g Fluid:  1.6-1.9L/day  Skin: Intact   Diet Order: Sodium Restricted  EDUCATION NEEDS: -Education needs addressed - discussed high calorie/protein diet therapy with husband and daughter and provided handout of this information    Intake/Output Summary (Last 24 hours) at 12/12/12 1658 Last data filed at 12/12/12 1409  Gross per 24 hour  Intake 558.75 ml  Output    550 ml  Net   8.75 ml    Last BM: 1/25  Labs:   Lab 12/12/12 0505 12/11/12 1513 12/11/12 0841  NA 137 -- 142  K 3.8 -- 3.7  CL 103 -- 106  CO2 23 -- --  BUN 19 -- 24*  CREATININE 1.65* 1.70* 1.70*  CALCIUM 8.7 -- --  MG -- -- --  PHOS -- -- --  GLUCOSE 107* -- 111*    CBG (last 3)  No results found for this basename: GLUCAP:3 in the last 72 hours  Scheduled Meds:   . calcitonin (salmon)  1 spray Alternating Nares Daily  . calcium carbonate  1 tablet Oral q morning - 10a  . clorazepate  7.5 mg Oral QHS  . cyanocobalamin  1,000 mcg Intramuscular Q30 days  . enoxaparin (LOVENOX) injection  30 mg Subcutaneous Q24H  . famotidine  20 mg Oral BID  . methocarbamol  500 mg Oral TID  . metoprolol succinate  50 mg Oral q morning - 10a  . multivitamin  1 tablet Oral Daily  . multivitamin with minerals  1 tablet Oral q morning - 10a  . potassium chloride  SA  30 mEq Oral Daily  . tiotropium  18 mcg Inhalation BID  . triamterene-hydrochlorothiazide  1 tablet Oral q morning - 10a  . Vitamin D (Ergocalciferol)  50,000 Units Oral 3 times weekly    Continuous Infusions:   Past Medical History  Diagnosis Date  . Arthritis   . Blood transfusion without reported diagnosis   . Cancer     lung, R breast, skin  . COPD (chronic obstructive pulmonary disease)   . Renal disorder     stage 3 kidney disease  . Stroke     TIA    Past Surgical History  Procedure Date  . Mastectomy     right  . Colon surgery   . Eye surgery   . Skin biopsy   . Tonsillectomy     Levon Hedger MS, RD, LDN [phone] Pager [phone] After  Hours Pager

## 2012-12-12 NOTE — Evaluation (Signed)
Physical Therapy Evaluation Patient Details Name: Paula Branch MRN: [id number] DOB: 02/08/1919 Today's Date: 12/12/2012 Time: 1020-1107 PT Time Calculation (min): 47 min  PT Assessment / Plan / Recommendation Clinical Impression  79 y.o. female with T12 and L2 compression fractures. Mod assist for bed mobility and transfers, tolerance limited by 9/10 pain in back. Pt/family agreeable to ST-SNF. Pt would benefit from acute PT to maximize safety and independence with mobility.     PT Assessment  Patient needs continued PT services    Follow Up Recommendations  SNF    Does the patient have the potential to tolerate intense rehabilitation      Barriers to Discharge None      Equipment Recommendations  None recommended by PT    Recommendations for Other Services OT consult   Frequency Min 3X/week    Precautions / Restrictions Precautions Precautions: Back Precaution Comments: T12, L2 compression fxs Restrictions Other Position/Activity Restrictions: WBAT   Pertinent Vitals/Pain **9/10 back pain with activity Premedicated, RN notified*      Mobility  Bed Mobility Bed Mobility: Rolling Left;Left Sidelying to Sit Rolling Left: 3: Mod assist Left Sidelying to Sit: HOB elevated;2: Max assist Details for Bed Mobility Assistance: increased time, elevated HOB for sidelying to sit, instructed in log roll, all mobility labored and limited by pain Transfers Transfers: Sit to Stand;Stand to Sit;Stand Pivot Transfers Sit to Stand: From bed;With upper extremity assist;3: Mod assist Stand to Sit: To chair/3-in-1;With upper extremity assist;3: Mod assist Stand Pivot Transfers: 3: Mod assist Details for Transfer Assistance: SPT with RW bed to 3 in 1 then to recliner, VCs 2* decreased vision (macular degeneration) Ambulation/Gait Ambulation/Gait Assistance: 3: Mod assist Ambulation Distance (Feet): 2 Feet Assistive device: Rolling walker Gait Pattern: Step-to  pattern;Decreased step length - left;Decreased stance time - right    Shoulder Instructions     Exercises     PT Diagnosis: Acute pain;Generalized weakness;Difficulty walking  PT Problem List: Decreased activity tolerance;Decreased mobility;Pain;Decreased strength;Decreased knowledge of use of DME PT Treatment Interventions: Gait training;Functional mobility training;DME instruction;Patient/family education;Therapeutic activities;Therapeutic exercise   PT Goals Acute Rehab PT Goals PT Goal Formulation: With patient/family Time For Goal Achievement: 12/26/12 Potential to Achieve Goals: Good Pt will Roll Supine to Left Side: with min assist PT Goal: Rolling Supine to Left Side - Progress: Goal set today Pt will go Supine/Side to Sit: with min assist;with HOB 0 degrees PT Goal: Supine/Side to Sit - Progress: Goal set today Pt will go Sit to Stand: with min assist PT Goal: Sit to Stand - Progress: Goal set today Pt will Transfer Bed to Chair/Chair to Bed: with min assist PT Transfer Goal: Bed to Chair/Chair to Bed - Progress: Goal set today Pt will Ambulate: 16 - 50 feet;with min assist PT Goal: Ambulate - Progress: Goal set today  Visit Information  Last PT Received On: 12/12/12 Assistance Needed: +2    Subjective Data      Prior Functioning  Home Living Lives With: Spouse Available Help at Discharge: Family;Available 24 hours/day Type of Home: House Home Access: Level entry Home Layout: Two level Alternate Level Stairs-Number of Steps: 2 Alternate Level Stairs-Rails: None Bathroom Shower/Tub: Engineer, manufacturing systems: Standard Home Adaptive Equipment: Walker - rolling;Walker - four wheeled;Grab bars in shower;Straight cane;Shower chair without back Prior Function Level of Independence: Independent Able to Take Stairs?: Yes Driving: No Comments: held onto walls in home, family states pt refused to use RW or cane Communication Communication: No  difficulties Dominant Hand:  Right    Cognition  Overall Cognitive Status: Appears within functional limits for tasks assessed/performed Arousal/Alertness: Awake/alert Orientation Level: Appears intact for tasks assessed Behavior During Session: [facility] for tasks performed    Extremity/Trunk Assessment Right Upper Extremity Assessment RUE ROM/Strength/Tone: [facility] for tasks assessed Left Upper Extremity Assessment LUE ROM/Strength/Tone: WFL for tasks assessed Right Lower Extremity Assessment RLE ROM/Strength/Tone: [facility] for tasks assessed (knee ext at least 3/5) RLE Sensation: WFL - Light Touch RLE Coordination: WFL - gross/fine motor Left Lower Extremity Assessment LLE ROM/Strength/Tone: WFL for tasks assessed (knee ext at least 3/5) LLE Sensation: WFL - Light Touch LLE Coordination: WFL - gross/fine motor Trunk Assessment Trunk Assessment: Kyphotic   Balance    End of Session PT - End of Session Activity Tolerance: Patient limited by pain Patient left: in chair;with family/visitor present;with call bell/phone within reach Nurse Communication: Mobility status  GP     Paula Branch 12/12/2012, 11:14 AM  [phone]

## 2012-12-12 NOTE — Evaluation (Signed)
Occupational Therapy Evaluation Patient Details Name: Paula Branch MRN: [id number] DOB: 1919/10/20 Today's Date: 12/12/2012 Time: 4098-1191 OT Time Calculation (min): 22 min  OT Assessment / Plan / Recommendation Clinical Impression  This 79 year old had been experiencing worsening back pain, fell and presented to the ED with more back pain.  She has a T 12 and L2 compression fx (one may be acute vs chronic).  She has a h/o copd, and ckd and is usually mod I at home with basic adls.  Pt will benefit from continued OT.  Goals in acute are min to mod A level.    OT Assessment  Patient needs continued OT Services    Follow Up Recommendations  SNF    Barriers to Discharge Other (comment) (husband home but uses cane himself)    Equipment Recommendations  3 in 1 bedside comode    Recommendations for Other Services    Frequency  Min 2X/week    Precautions / Restrictions Precautions Precautions: Back Precaution Comments: T12, L2 compression fxs Restrictions Other Position/Activity Restrictions: WBAT   Pertinent Vitals/Pain Back pain present: a lot. Requested pain medicine and repositioned    ADL  Eating/Feeding: Performed;Supervision/safety;Set up (pt has macular degeneration:  needs help to find items) Where Assessed - Eating/Feeding: Chair Grooming: Simulated;Set up Where Assessed - Grooming: Supported sitting Upper Body Bathing: Simulated;Set up;Supervision/safety Where Assessed - Upper Body Bathing: Supported sitting Lower Body Bathing: Simulated;Maximal assistance Where Assessed - Lower Body Bathing: Supported sit to stand Upper Body Dressing: Simulated;Minimal assistance Where Assessed - Upper Body Dressing: Supported sitting Lower Body Dressing: Simulated;+1 Total assistance Where Assessed - Lower Body Dressing: Supported sit to Pharmacist, hospital: Performed;Moderate assistance Toilet Transfer Method: Stand pivot Toilet Transfer Equipment: Bedside  commode Toileting - Clothing Manipulation and Hygiene: Performed;+1 Total assistance Where Assessed - Glass blower/designer Manipulation and Hygiene: Standing Equipment Used: Rolling walker Transfers/Ambulation Related to ADLs: SPT from 3:1 to recliner.  Pt nauseaus during transfer--resolved.   ADL Comments: ADLs limited by pain.  Pt has macular degeneration:  AE will be difficult to use    OT Diagnosis: Generalized weakness  OT Problem List: Decreased strength;Decreased activity tolerance;Impaired balance (sitting and/or standing);Decreased knowledge of use of DME or AE;Decreased knowledge of precautions;Pain OT Treatment Interventions: Self-care/ADL training;DME and/or AE instruction;Therapeutic activities;Patient/family education;Balance training   OT Goals Acute Rehab OT Goals OT Goal Formulation: With patient Time For Goal Achievement: 12/26/12 Potential to Achieve Goals: Good ADL Goals Pt Will Perform Lower Body Bathing: with min assist;Sit to stand from chair (with AE (simulate with sponge vs. reacher)) ADL Goal: Lower Body Bathing - Progress: Goal set today Pt Will Transfer to Toilet: with min assist;Stand pivot transfer;3-in-1 ADL Goal: Toilet Transfer - Progress: Goal set today Pt Will Perform Toileting - Hygiene: with min assist;Sit to stand from 3-in-1/toilet ADL Goal: Toileting - Hygiene - Progress: Goal set today Miscellaneous OT Goals Miscellaneous OT Goal #1: Pt will perform bed mobility with min A in preparation for adls/3:1 transfer OT Goal: Miscellaneous Goal #1 - Progress: Goal set today  Visit Information  Last OT Received On: 12/12/12 Assistance Needed: +2    Subjective Data  Subjective: Thank you so much for helping me Patient Stated Goal: pt/family want to consider [facility] for rehab   Prior Functioning     Home Living Lives With: Spouse Available Help at Discharge: Family;Available 24 hours/day (husband walks with cane) Type of Home: House Home Access:  Level entry Home Layout: Two level Alternate Level Stairs-Number  of Steps: 2 Alternate Level Stairs-Rails: None Bathroom Shower/Tub: Engineer, manufacturing systems: Standard Home Adaptive Equipment: Walker - rolling;Walker - four wheeled;Grab bars in shower;Straight cane;Shower chair without back Prior Function Level of Independence: Independent Able to Take Stairs?: Yes Driving: No Comments: held onto walls in home, family states pt refused to use RW or cane Communication Communication: No difficulties Dominant Hand: Right         Vision/Perception     Cognition  Overall Cognitive Status: Appears within functional limits for tasks assessed/performed Arousal/Alertness: Awake/alert Orientation Level: Appears intact for tasks assessed Behavior During Session: [facility] for tasks performed    Extremity/Trunk Assessment Right Upper Extremity Assessment RUE ROM/Strength/Tone: [facility] for tasks assessed (did not fully assess due to pain (bil)) Left Upper Extremity Assessment LUE ROM/Strength/Tone: WFL for tasks assessed Trunk Assessment Trunk Assessment: Kyphotic     Mobility  Transfers Sit to Stand: 3: Mod assist;From chair/3-in-1;With armrests Stand to Sit: To chair/3-in-1;With upper extremity assist;3: Mod assist Details for Transfer Assistance: cues for hand placement and steps     Shoulder Instructions     Exercise     Balance     End of Session OT - End of Session Activity Tolerance: Patient limited by pain (and nausea) Patient left: in chair;with call bell/phone within reach;with family/visitor present  GO Functional Assessment Tool Used: clinical observation/judgement Functional Limitation: Self care Self Care Current Status (Z6109): 100 percent impaired, limited or restricted Self Care Goal Status (U0454): At least 20 percent but less than 40 percent impaired, limited or restricted   [facility] 12/12/2012, 11:44 AM Marica Otter,  OTR/L [phone] 12/12/2012

## 2012-12-12 NOTE — Progress Notes (Signed)
Patient's O2 sats sitting around 85% on room air. Placed patient on 2L of O2 per nasal cannula. VSS, breathing unlabored. Patient sleeping.  Der Gagliano N 4:49 AM

## 2012-12-13 DIAGNOSIS — K912 Postsurgical malabsorption, not elsewhere classified: Secondary | ICD-10-CM | POA: Diagnosis present

## 2012-12-13 MED ORDER — DOCUSATE SODIUM 100 MG PO CAPS
100.0000 mg | ORAL_CAPSULE | Freq: Two times a day (BID) | ORAL | Status: DC
  Administered 2012-12-13 – 2012-12-14 (×3): 100 mg via ORAL

## 2012-12-13 MED ORDER — TIOTROPIUM BROMIDE MONOHYDRATE 18 MCG IN CAPS
18.0000 ug | ORAL_CAPSULE | Freq: Every day | RESPIRATORY_TRACT | Status: DC
  Administered 2012-12-14: 18 ug via RESPIRATORY_TRACT

## 2012-12-13 NOTE — Progress Notes (Signed)
Physical Therapy Treatment Patient Details Name: Paula Branch MRN: [id number] DOB: 12-Jan-1919 Today's Date: 12/13/2012 Time: [id number] PT Time Calculation (min): 27 min  PT Assessment / Plan / Recommendation Comments on Treatment Session  Assisted pt from bed to BR to void then amb limited distance to recliner.  Mild unsteady gait with "some" c/o back pain and max c/o fatigue.  Pt demon 3/4 DOE and RA recorded lowest at 84% so reapplied 2 lts nasal.  Pt has a Hx COPD. Pt plans to D/C to [facility] for Rehab.    Follow Up Recommendations  SNF     Does the patient have the potential to tolerate intense rehabilitation     Barriers to Discharge        Equipment Recommendations  None recommended by PT    Recommendations for Other Services    Frequency Min 3X/week   Plan Discharge plan remains appropriate    Precautions / Restrictions Precautions Precautions: Back;Fall Precaution Comments: T12, L2 compression fxs  Restrictions Weight Bearing Restrictions: No   Pertinent Vitals/Pain "some" back pain with amb Pre medicated    Mobility  Bed Mobility Bed Mobility: Not assessed Details for Bed Mobility Assistance: Pt sitting EOB on arrival with family in room Transfers Transfers: Sit to Stand;Stand to Sit Sit to Stand: 4: Min assist;From toilet;From bed Stand to Sit: 4: Min assist;To toilet;To chair/3-in-1 Details for Transfer Assistance: 25% Vc's on proper tech and hand placement Ambulation/Gait Ambulation/Gait Assistance: 3: Mod assist;4: Min assist Ambulation Distance (Feet): 22 Feet (11' x 2) Assistive device: Rolling walker Ambulation/Gait Assistance Details: 25% VC's safety with turns and proper walker to self distance. Gait Pattern: Step-to pattern;Step-through pattern;Narrow base of support;Decreased step length - right;Decreased step length - left Gait velocity: decreased General Gait Details: mild unsteady with "some" c/o back pain     PT Goals                                              progressing    Visit Information  Last PT Received On: 12/13/12 Assistance Needed: +1    Subjective Data  Subjective: I need to use the bathroom Patient Stated Goal: n/a   Cognition       Balance     End of Session PT - End of Session Equipment Utilized During Treatment: Gait belt Activity Tolerance: Patient limited by pain;Patient limited by fatigue Patient left: in chair;with call bell/phone within reach;with family/visitor present   Felecia Shelling  PTA [facility]  Acute  Rehab Pager      [phone]

## 2012-12-13 NOTE — Plan of Care (Signed)
Problem: Phase III Progression Outcomes Goal: Discharge plan remains appropriate-arrangements made Outcome: Progressing Patient is being followed by CSW plans to go to SNF for Rehab 12/15/11 as long as she remain stable throughout the night.

## 2012-12-13 NOTE — Progress Notes (Signed)
CSW assisting with d/c planning. P/family has accepted a SNF bed at [facility]. SNF bed is available today / tomorrow if pt is stable for d/c. CSW will follow to assist with d/c planning to SNF.  Cori Razor LCSW [phone]

## 2012-12-13 NOTE — Progress Notes (Signed)
Assessment/Plan: Active Problems:  T12 compression fracture - has increased pain today. Encouraged her to be sure to ask for pain meds. Will add stool softener for now given constipation. Told her that this was not unusual with comp fx and with pain meds. I discussed other meds with her and she now says "I'll take something for my bones." May want to consider Forteo in the future.   COPD (chronic obstructive pulmonary disease)  CKD (chronic kidney disease) - checked office chart and creatinine is at baseline  History of breast cancer  Acquired short bowel syndrome - she has dealt with this for years. I reviewed RD recommendations and I don't think a GI consult would be helpful. She is not losing weight due to diarrhea (or she would have done it in the past) but because of poorer intake. She has used some anti-diarrheals with success in the past. She is now constipated as above.    Subjective: Pain is worse over night. Is relieved by pain meds but she is trying not to take them. No BM times four days which is highly unusual for her.   She has chronic post prandial loose stools x years due to short bowel syndrome. Recently has been eating more poorly and has lost weight. I discussed this with her at her OV in my office last week.   In regard to osteoporosis: dx 1995, very Vit D deficient and is on supplements (50000 IU Vit D tiw) since then; didronel 1996-2000; Miacalcin 2000-2009; bisphosphonates 2009-2010 and refused any meds since then; last DEXA 2009 T score -3.9.   Objective:  Vital Signs: Filed Vitals:   12/12/12 1407 12/12/12 2011 12/12/12 2106 12/13/12 0631  BP: 112/65 106/61  111/65  Pulse: 66 66  69  Temp: 98.8 F (37.1 C) 98.3 F (36.8 C)  97.5 F (36.4 C)  TempSrc: Oral     Resp: 20 18  20   Height:      Weight:      SpO2: 96% 95% 96% 90%     EXAM: appears uncomfortable   Intake/Output Summary (Last 24 hours) at 12/13/12 0709 Last data filed at 12/13/12 0252  Gross per  24 hour  Intake    360 ml  Output    425 ml  Net    -65 ml    Lab Results:  Basename 12/12/12 0505 12/11/12 1513 12/11/12 0841  NA 137 -- 142  K 3.8 -- 3.7  CL 103 -- 106  CO2 23 -- --  GLUCOSE 107* -- 111*  BUN 19 -- 24*  CREATININE 1.65* 1.70* --  CALCIUM 8.7 -- --  MG -- -- --  PHOS -- -- --   No results found for this basename: AST:2,ALT:2,ALKPHOS:2,BILITOT:2,PROT:2,ALBUMIN:2 in the last 72 hours No results found for this basename: LIPASE:2,AMYLASE:2 in the last 72 hours  Basename 12/11/12 0841 12/11/12 0812  WBC -- 11.2*  NEUTROABS -- --  HGB 13.3 13.4  HCT 39.0 39.4  MCV -- 88.5  PLT -- 138*   No results found for this basename: CKTOTAL:3,CKMB:3,CKMBINDEX:3,TROPONINI:3 in the last 72 hours No components found with this basename: POCBNP:3 No results found for this basename: DDIMER:2 in the last 72 hours No results found for this basename: HGBA1C:2 in the last 72 hours No results found for this basename: CHOL:2,HDL:2,LDLCALC:2,TRIG:2,CHOLHDL:2,LDLDIRECT:2 in the last 72 hours  Basename 12/11/12 1513  TSH 1.664  T4TOTAL --  T3FREE --  THYROIDAB --   No results found for this basename: VITAMINB12:2,FOLATE:2,FERRITIN:2,TIBC:2,IRON:2,RETICCTPCT:2 in the  last 72 hours  Studies/Results: Ct Abdomen Pelvis Wo Contrast  12/11/2012  *RADIOLOGY REPORT*  Clinical Data: Right flank pain and right-sided low back pain. Current history of lung cancer.  Recent fall.  CT ABDOMEN AND PELVIS WITHOUT CONTRAST  Technique:  Multidetector CT imaging of the abdomen and pelvis was performed following the standard protocol without intravenous contrast.  Comparison: None.  Findings: Approximate 7 x 10 mm non-obstructing calculus in a lower pole calix of the left kidney.  No urinary tract calculi elsewhere on either side.  Cortical thinning involving both kidneys consistent with age.  Mild perinephric stranding bilaterally. Within the limits of the unenhanced technique, no focal parenchymal  abnormality involving either kidney.  Normal unenhanced appearance of the liver, pancreas, adrenal glands, and gallbladder.  No biliary ductal dilation.  Dystrophic appearing calcifications in the spleen which is otherwise unremarkable.  Moderate to severe aorto-iliofemoral atherosclerosis without aneurysm.  No significant lymphadenopathy.  Small hiatal hernia.  Stomach decompressed and otherwise unremarkable.  Ileocolic anastomosis (ileum to the ascending colon) in the right side of the abdomen which appears patent.  Small bowel normal in appearance.  Moderate stool burden in the colon. Scattered sigmoid colon diverticula.  Sigmoid colon tortuous and redundant.:  Otherwise unremarkable.  Appendix surgically absent. No ascites.  Uterus is markedly atrophic consistent with age.  No adnexal masses or free pelvic fluid.  Urinary bladder unremarkable. Bone window images demonstrate compression fractures involving T12 and L2; the T12 fracture may be acute subacute.  Visualized lung bases clear apart from minimal scarring in the lingula.  Heart size upper normal.  IMPRESSION:  1.  Non-obstructing 7 x 10 mm calculus in a lower pole calix of the left kidney.  No urinary tract calculi elsewhere. 2.  Small hiatal hernia. 3.  Scattered sigmoid colon diverticula without evidence of acute diverticulitis. 4.  Compression fractures of T12 and L2.  The T12 fracture may be acute or subacute.  Please correlate with point tenderness.   Original Report Authenticated By: Hulan Saas, M.D.    Medications: Medications administered in the last 24 hours reviewed.  Current Medication List reviewed.    LOS: 2 days   [facility] Internal Medicine @ [facility] [phone]) 12/13/2012, 7:09 AM

## 2012-12-14 DIAGNOSIS — H543 Unqualified visual loss, both eyes: Secondary | ICD-10-CM | POA: Diagnosis present

## 2012-12-14 DIAGNOSIS — M8000XA Age-related osteoporosis with current pathological fracture, unspecified site, initial encounter for fracture: Secondary | ICD-10-CM | POA: Diagnosis present

## 2012-12-14 DIAGNOSIS — H3530 Unspecified macular degeneration: Secondary | ICD-10-CM | POA: Diagnosis present

## 2012-12-14 MED ORDER — ENSURE COMPLETE PO LIQD: 237.0000 mL | Freq: Two times a day (BID) | ORAL | Status: AC

## 2012-12-14 MED ORDER — FAMOTIDINE 20 MG PO TABS: 20.0000 mg | ORAL_TABLET | Freq: Two times a day (BID) | ORAL | Status: AC | PRN

## 2012-12-14 MED ORDER — BISACODYL 5 MG PO TBEC: 10.0000 mg | DELAYED_RELEASE_TABLET | Freq: Every day | ORAL | Status: AC | PRN

## 2012-12-14 MED ORDER — HYDROCODONE-ACETAMINOPHEN 5-325 MG PO TABS: 1.0000 | ORAL_TABLET | ORAL | Status: AC | PRN

## 2012-12-14 NOTE — Discharge Summary (Signed)
Physician Discharge Summary  NAME:Paula Branch  ZOX:[id number]  DOB: 1918/12/06   Admit date: 12/11/2012 Discharge date: 12/14/2012  Admitting Diagnosis: compression fracture  Discharge Diagnoses:  Active Problems:  T12 compression fracture  COPD (chronic obstructive pulmonary disease)  CKD (chronic kidney disease)  History of breast cancer  Acquired short bowel syndrome  Blindness of both eyes  Macular degeneration of both eyes  Osteoporosis with pathological fracture   Discharge Condition: improved  Hospital Course: Patient admitted after fall at home. Had some back pain prior to this and much worse after the fall. She came to ED.   CT scan of back revealed subacute to acute compression fracture. She was treated with IV pain meds. She was able to work with PT to some degree but it was clear that SNF rehab would be needed to allow her to recover sufficiently to be at home with her elderly husband.   Her baseline activity is that she uses walls, chairs, and walker to get around. She is basically blind due to macular degeneration.   Consults: PT, OT  Disposition: SNF- rehab, then home.   Discharge Orders    Future Orders Please Complete By Expires   Diet - low sodium heart healthy      Increase activity slowly      Comments:   Per PT and OT       Medication List     As of 12/14/2012  9:34 AM    TAKE these medications         acetaminophen 500 MG tablet   Commonly known as: TYLENOL   Take 500 mg by mouth every 6 (six) hours as needed. For pain      albuterol 108 (90 BASE) MCG/ACT inhaler   Commonly known as: PROVENTIL HFA;VENTOLIN HFA   Inhale 2 puffs into the lungs every 6 (six) hours as needed. For wheezing or shortness of breath      albuterol (2.5 MG/3ML) 0.083% nebulizer solution   Commonly known as: PROVENTIL   Take 2.5 mg by nebulization every 6 (six) hours as needed. For wheezing or shortness of breath      beta carotene w/minerals tablet   Take  1 tablet by mouth every morning.      bisacodyl 5 MG EC tablet   Commonly known as: DULCOLAX   Take 2 tablets (10 mg total) by mouth daily as needed.      calcium carbonate 500 MG chewable tablet   Commonly known as: TUMS - dosed in mg elemental calcium   Chew 1 tablet by mouth every morning.      clorazepate 7.5 MG tablet   Commonly known as: TRANXENE   Take 7.5 mg by mouth daily.      cyanocobalamin 1000 MCG/ML injection   Commonly known as: (VITAMIN B-12)   Inject 1,000 mcg into the muscle every 30 (thirty) days.      famotidine 20 MG tablet   Commonly known as: PEPCID   Take 1 tablet (20 mg total) by mouth 2 (two) times daily as needed. For acid reflux      feeding supplement Liqd   Take 237 mLs by mouth 2 (two) times daily between meals.      GLUCOSAMINE CHONDR COMPLEX PO   Take 1 tablet by mouth every morning.      HYDROcodone-acetaminophen 5-325 MG per tablet   Commonly known as: NORCO/VICODIN   Take 1-2 tablets by mouth every 4 (four) hours as needed.  loperamide 2 MG capsule   Commonly known as: IMODIUM   Take 2 mg by mouth daily as needed. For diarrhea      metoprolol succinate 50 MG 24 hr tablet   Commonly known as: TOPROL-XL   Take 50 mg by mouth every morning. Take with or immediately following a meal.      multivitamin with minerals Tabs   Take 1 tablet by mouth every morning.      potassium chloride SA 20 MEQ tablet   Commonly known as: K-DUR,KLOR-CON   Take 30 mEq by mouth daily.      tiotropium 18 MCG inhalation capsule   Commonly known as: SPIRIVA   Place 18 mcg into inhaler and inhale 2 (two) times daily.      triamterene-hydrochlorothiazide 75-50 MG per tablet   Commonly known as: MAXZIDE   Take 1 tablet by mouth every morning.      Vitamin D (Ergocalciferol) 50000 UNITS Caps   Commonly known as: DRISDOL   Take 50,000 Units by mouth 3 (three) times a week.          Things to follow up in the outpatient setting: will need to  discuss bone building therapy after she is discharged home. (In regard to osteoporosis: dx 1995, very Vit D deficient and is on supplements (50000 IU Vit D tiw) since then; didronel 1996-2000; Miacalcin 2000-2009; bisphosphonates 2009-2010 and refused any meds since then; last DEXA 2009 T score -3.9. )    Time coordinating discharge: 30 minutes including medication reconciliation, printing  Prescriptions,  preparation of discharge papers, and discussion with patient  and family    Signed: Darnelle Bos 12/14/2012, 9:27 AM

## 2012-12-14 NOTE — Progress Notes (Signed)
Report called to Antionette at [facility] 

## 2012-12-14 NOTE — Progress Notes (Signed)
12/12/12 1113  PT G-Codes **NOT FOR INPATIENT CLASS**  Functional Assessment Tool Used clinical judgement as noted in documentation  (clinical judgement as noted in documentation )  Functional Limitation Mobility: Walking and moving around  Mobility: Walking and Moving Around Current Status (Z6109) CM  Mobility: Walking and Moving Around Goal Status (U0454) CJ  PT General Charges  $$ ACUTE PT VISIT 1 Procedure  PT Evaluation  $Initial PT Evaluation Tier I 1 Procedure  PT Treatments  $Therapeutic Activity 23-37 mins   g-codes entered by Marella Bile, PT, eval completed by Comer Locket.

## 2012-12-15 NOTE — Progress Notes (Signed)
Clinical Social Work Department CLINICAL SOCIAL WORK PLACEMENT NOTE 12/15/2012  Patient:  Paula Branch, Paula Branch  Account Number:  [id number] Admit date:  12/11/2012  Clinical Social Worker:  Cori Razor, LCSW  Date/time:  12/12/2012 04:45 PM  Clinical Social Work is seeking post-discharge placement for this patient at the following level of care:   SKILLED NURSING   (*CSW will update this form in Epic as items are completed)   12/12/2012  Patient/family provided with [facility] Department of Clinical Social Work's list of facilities offering this level of care within the geographic area requested by the patient (or if unable, by the patient's family).  12/12/2012  Patient/family informed of their freedom to choose among providers that offer the needed level of care, that participate in Medicare, Medicaid or managed care program needed by the patient, have an available bed and are willing to accept the patient.    Patient/family informed of [facility]' ownership interest in [facility], as well as of the fact that they are under no obligation to receive care at this facility.  PASARR submitted to EDS on 12/12/2012 PASARR number received from [employer] on 12/12/2012  FL2 transmitted to all facilities in geographic area requested by pt/family on  12/12/2012 FL2 transmitted to all facilities within larger geographic area on   Patient informed that his/her managed care company has contracts with or will negotiate with  certain facilities, including the following:     Patient/family informed of bed offers received:  12/13/2012 Patient chooses bed at [facility] AND [facility] Physician recommends and patient chooses bed at    Patient to be transferred to [facility] AND REHAB on  12/14/2012 Patient to be transferred to facility by [facility]  The following physician request were entered in Epic:   Additional Comments:  Cori Razor LCSW [phone]

## 2013-07-11 ENCOUNTER — Other Ambulatory Visit: Payer: Self-pay | Admitting: Gastroenterology

## 2013-07-11 DIAGNOSIS — R1310 Dysphagia, unspecified: Secondary | ICD-10-CM

## 2013-07-14 ENCOUNTER — Other Ambulatory Visit: Payer: Medicare Other

## 2014-08-31 ENCOUNTER — Other Ambulatory Visit: Payer: Self-pay | Admitting: Internal Medicine

## 2014-08-31 DIAGNOSIS — R102 Pelvic and perineal pain: Secondary | ICD-10-CM

## 2014-08-31 DIAGNOSIS — M79652 Pain in left thigh: Secondary | ICD-10-CM

## 2014-09-11 ENCOUNTER — Ambulatory Visit
Admission: RE | Admit: 2014-09-11 | Discharge: 2014-09-11 | Disposition: A | Discharge status: Home / Self Care | Payer: 59 | Source: Ambulatory Visit | Attending: Internal Medicine | Admitting: Internal Medicine

## 2014-09-11 DIAGNOSIS — M79652 Pain in left thigh: Secondary | ICD-10-CM

## 2014-09-11 DIAGNOSIS — R102 Pelvic and perineal pain: Secondary | ICD-10-CM

## 2015-03-20 ENCOUNTER — Ambulatory Visit
Admission: RE | Admit: 2015-03-20 | Discharge: 2015-03-20 | Disposition: A | Discharge status: Home / Self Care | Payer: Medicare Other | Source: Ambulatory Visit | Attending: Internal Medicine | Admitting: Internal Medicine

## 2015-03-20 ENCOUNTER — Other Ambulatory Visit: Payer: Self-pay | Admitting: Internal Medicine

## 2015-03-20 DIAGNOSIS — R0781 Pleurodynia: Secondary | ICD-10-CM

## 2015-09-09 ENCOUNTER — Ambulatory Visit
Admission: RE | Admit: 2015-09-09 | Discharge: 2015-09-09 | Disposition: A | Discharge status: Home / Self Care | Payer: Medicare Other | Source: Ambulatory Visit | Attending: Internal Medicine | Admitting: Internal Medicine

## 2015-09-09 ENCOUNTER — Other Ambulatory Visit: Payer: Self-pay | Admitting: Internal Medicine

## 2015-09-09 DIAGNOSIS — M7989 Other specified soft tissue disorders: Secondary | ICD-10-CM

## 2015-12-05 IMAGING — CR DG CHEST 2V
2 series · 2 of 2 positions shown · non-contrast
Comparison: PA and lateral chest x-ray June 27, 2012

CLINICAL DATA: Anterior mid to lower ribcage discomfort for 4 days
following a sneezing and coughing episode, shortness of breath,
history of right mastectomy for breast malignancy; history of lung
malignancy and COPD.

EXAM:
CHEST  2 VIEW

[view not recorded (1 of 2)]
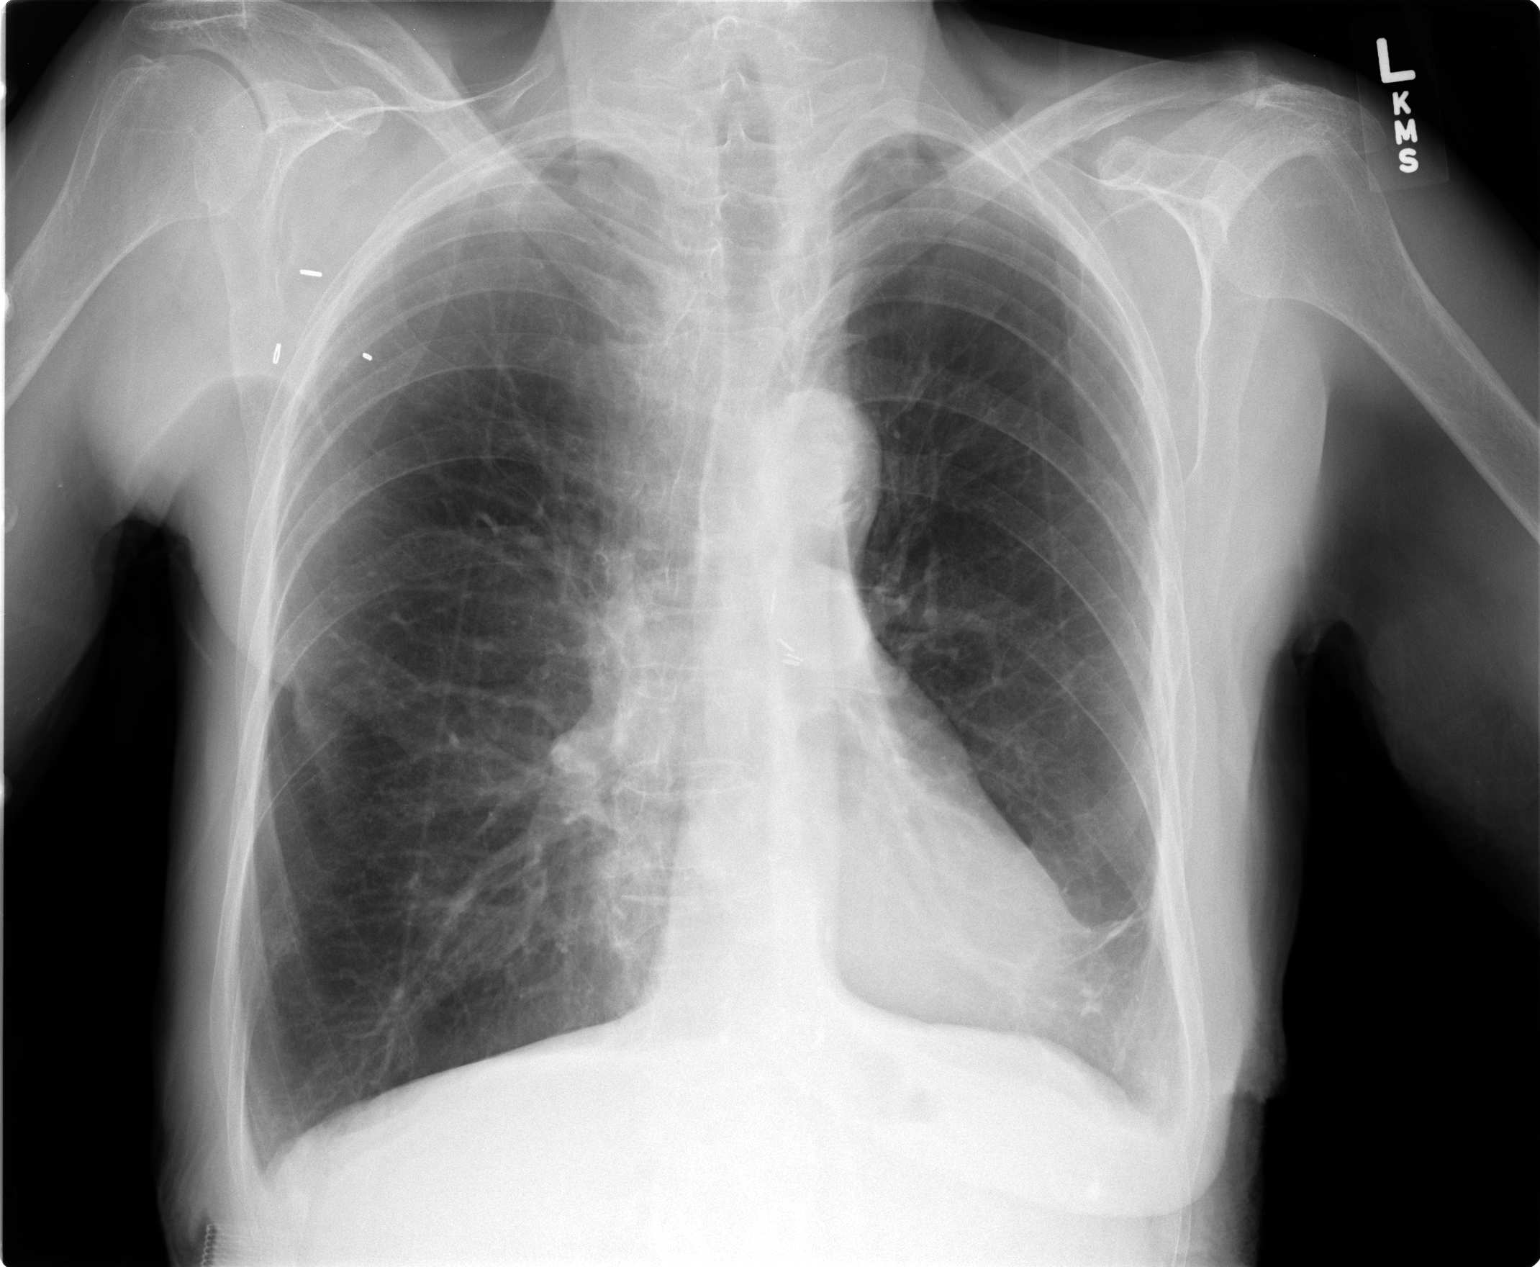

[view not recorded (2 of 2)]
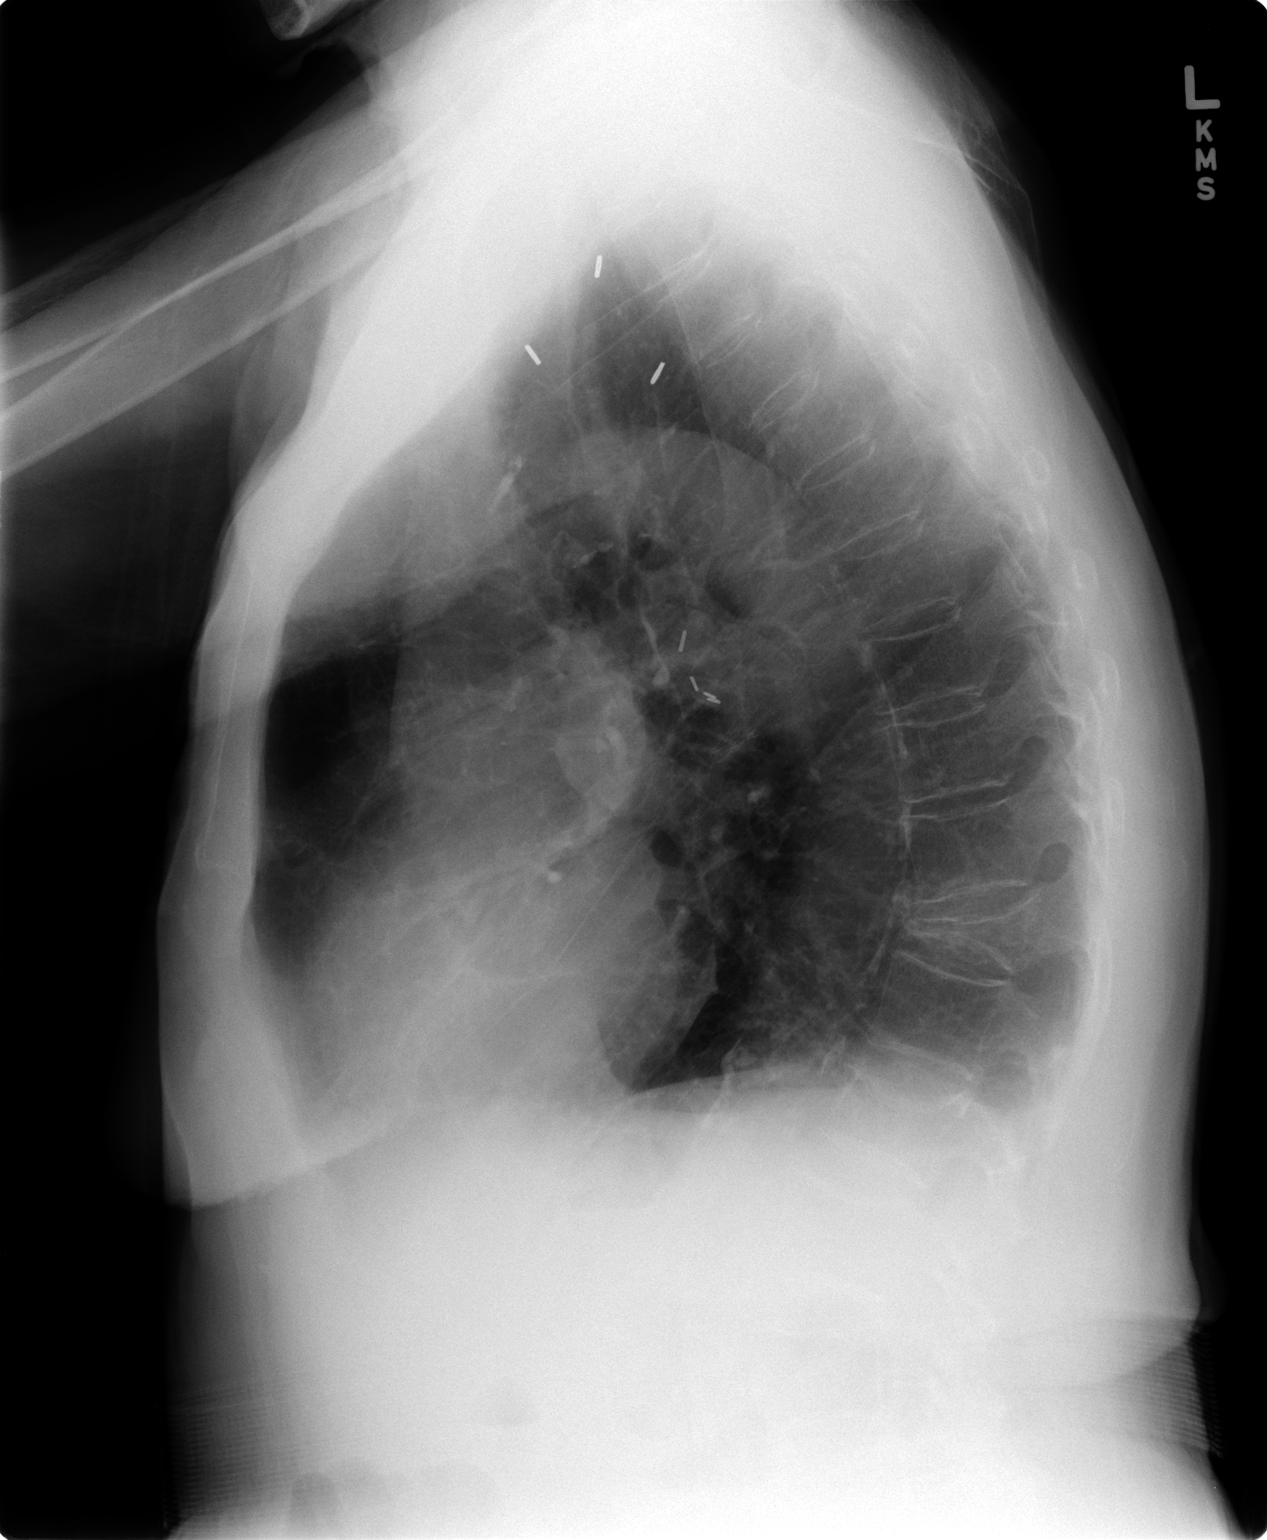

[2 of 2 positions shown; findings below may reference images not displayed]

FINDINGS: The lungs are hyperinflated with hemidiaphragm flattening. There is
stable scarring in the lingula. The heart and pulmonary vascularity
are normal. There are surgical clips in the gallbladder fossa. There
is high-grade wedge compression of the body of T12 which has
progressed since the previous study. Where visualized the ribs are
unremarkable.
IMPRESSION: COPD. There has been interval progression of the T12 compression
fracture with loss of height of sign or approximately 95%
anteriorly.

## 2019-08-17 DEATH — deceased
